# Patient Record
Sex: Male | Born: 1975 | Race: White | Hispanic: No | Marital: Married | Smoking: Never smoker
Health system: Southern US, Community
[De-identification: ages and names within clinical notes are randomized; demographics above are authoritative.]

## PROBLEM LIST (undated history)

## (undated) DIAGNOSIS — E785 Hyperlipidemia, unspecified: Secondary | ICD-10-CM

## (undated) DIAGNOSIS — D239 Other benign neoplasm of skin, unspecified: Secondary | ICD-10-CM

## (undated) HISTORY — PX: KNEE SURGERY: SHX244

---

## 2000-10-15 ENCOUNTER — Encounter: Admission: RE | Admit: 2000-10-15 | Discharge: 2000-10-15 | Payer: Self-pay | Admitting: Sports Medicine

## 2002-04-22 ENCOUNTER — Encounter: Admission: RE | Admit: 2002-04-22 | Discharge: 2002-04-22 | Payer: Self-pay | Admitting: Family Medicine

## 2004-01-08 ENCOUNTER — Ambulatory Visit (HOSPITAL_COMMUNITY): Admission: RE | Admit: 2004-01-08 | Discharge: 2004-01-08 | Payer: Self-pay | Admitting: Gastroenterology

## 2009-04-05 ENCOUNTER — Ambulatory Visit: Payer: Self-pay | Admitting: Sports Medicine

## 2009-04-05 DIAGNOSIS — M7750 Other enthesopathy of unspecified foot: Secondary | ICD-10-CM | POA: Insufficient documentation

## 2010-12-02 NOTE — Op Note (Signed)
NAME:  Randy Quinn, Randy Quinn                         ACCOUNT NO.:  [id number]   MEDICAL RECORD NO.:  [id number]                   PATIENT TYPE:  AMB   LOCATION:  ENDO                                 FACILITY:  [facility]   PHYSICIAN:  John C. Madilyn Fireman, M.D.                 DATE OF BIRTH:  [birth year]   DATE OF PROCEDURE:  01/08/2004  DATE OF DISCHARGE:                                 OPERATIVE REPORT   PROCEDURE:  Esophagogastroduodenoscopy with esophageal dilatation.   INDICATIONS FOR PROCEDURE:  Probable solid food dysphagia with some atypical  features by description but overall suggestive of lower esophageal ring or  stricture.   DESCRIPTION OF PROCEDURE:  The patient was placed in the left lateral  decubitus position then placed on the pulse monitor with continuous low flow  oxygen delivered by nasal cannula. He was sedated with 75 mcg IV fentanyl  and 6 mg IV Versed. The Olympus video endoscope was advanced under direct  vision into the oropharynx and esophagus. The esophagus was straight and of  normal caliber with the squamocolumnar line at 38 cm. There did appear to be  a subtle lower esophageal ring or stricture that was only seen well on the  one occasion and photographed and I could not be sure it did not represent a  moment of spasm at the GE junction.  There appeared to be a small hiatal  hernia distal to it.  The esophagus otherwise appeared normal.  The stomach  was entered and a small amount of liquid secretions were suctioned from the  fundus. Retroflexed view of the cardia was unremarkable. The fundus, body,  antrum and pylorus all appeared normal. The duodenum was entered and both  the bulb and second portion were well inspected and appeared to be within  normal limits. The Savary guidewire was passed through the endoscope channel  and the scope withdrawn.  A single 18 mm Savary dilator was passed over the  guidewire with mild resistance and no blood seen on withdrawal.  The  dilators were removed together with the wire and the patient returned to the  recovery room in stable condition.  He tolerated the procedure well and  there were no immediate complications.   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   PLAN:  Advance diet and observe response to dilatation, if no response will  obtain barium swallow to assess for esophageal dysmotility and more  conclusively rule out a Zenker's diverticulum or cricopharyngeal narrowing  of some sort.                                               John C. Madilyn Fireman, M.D.    JCH/MEDQ  D:  01/08/2004  T:  01/08/2004  Job:  [id number] 

## 2013-01-02 ENCOUNTER — Telehealth: Payer: Self-pay | Admitting: Gastroenterology

## 2013-01-02 MED ORDER — PENICILLIN V POTASSIUM 500 MG PO TABS: 500.0000 mg | ORAL_TABLET | Freq: Two times a day (BID) | ORAL | Status: DC

## 2013-01-02 NOTE — Telephone Encounter (Signed)
Randy Quinn is a friend of mine.  His 37 yo son has confirmed strep throat and was put on abx today.  Randy Quinn's throat is very sore now as well, erythematous on exam.  He called his [facility] but has not heard back after an hour.  [facility].  He is going on business trip tomorrow  Given high index of suspicion I am going to start him on pen v 500mg  bid for 10 days.  He has no drug allergies.

## 2013-03-13 ENCOUNTER — Ambulatory Visit (INDEPENDENT_AMBULATORY_CARE_PROVIDER_SITE_OTHER): Payer: BC Managed Care – PPO | Admitting: Sports Medicine

## 2013-03-13 VITALS — BP 131/74 | Ht 70.0 in | Wt 190.0 lb

## 2013-03-13 DIAGNOSIS — IMO0002 Reserved for concepts with insufficient information to code with codable children: Secondary | ICD-10-CM

## 2013-03-13 DIAGNOSIS — M9905 Segmental and somatic dysfunction of pelvic region: Secondary | ICD-10-CM | POA: Insufficient documentation

## 2013-03-13 DIAGNOSIS — M999 Biomechanical lesion, unspecified: Secondary | ICD-10-CM

## 2013-03-13 DIAGNOSIS — S76912A Strain of unspecified muscles, fascia and tendons at thigh level, left thigh, initial encounter: Secondary | ICD-10-CM | POA: Insufficient documentation

## 2013-03-13 NOTE — Progress Notes (Signed)
  [facility]  Patient name: Randy Quinn MRN [id number]  Date of birth: November 14, 1975  CC & HPI:  Randy Quinn is a 37 y.o. male who is returning to care.   Patient presents today for evaluation of diffuse low back pain.  Pain began 4 days ago while out watering his vegetablel garden.  He reports he was bending over for prolonged period of time and stood up abruptly and had acute onset of sharp stabbing bilateral lower back pain.  Does report his symptoms seem to be worse on the left compared to the right.  He has no radicular symptoms, no weakness, no paresthesia.  Reports that bending over and bracing his back seems to help his symptoms.  He is currently training for marathon.  He has not had any prior back pain prior to this episode.  He not been doing any activity leading up to his gardening and had taken an easy week that week with his running.  He has worked up to 15 miles.  Since his injury he has been taking ibuprofen 400-600mg  2-3 times per day but has not taken any today.  Reports the pain does not seem like it is significantly improved over the last 2 days but is much better than his initial injury.  Patient denies any fevers, chills, weight loss,   ROS:  See HPI  Pertinent History Reviewed:  Medical & Surgical Hx:  Reviewed: Significant for otherwise healthy Medications: Reviewed & Updated - see associated section Social History: Reviewed -  has no tobacco history on file.   Objective Findings:  Vitals: BP 131/74  Ht 5\' 10"  (1.778 m)  Wt 190 lb (86.183 kg)  BMI 27.26 kg/m2  PE: GENERAL:  adult Caucasian male. In no discomfort; no respiratory distress  PSYCH:  alert and appropriate, good insight      NeuroMSK  back Exam: Appear:  normal appearance, no bruising, no ecchymosis.    Palp:  history palpation over the left lumbar paraspinal musculature.  No midline tenderness..    NV:   The bilateral lower extremity myotomes are 5+/5.  Sensation is grossly  intact to light sensation in lower extremity dermatomes..  No marked tenderness of Quadratus Lumborum  Testing:  negative straight leg raise bilaterally.  He has a functional leg length discrepancy on the left that is worsened with sitting but corrects with further evaluation and treatment.   ASIS compression test positive on the left.  Left anterior Innominate.  Initially positive Thompson test on the left that corrects.  He has pain with left-sided lunge stretch.  Mildly positive FABERon the left radiating to the lateral thigh but good motion; normal FADIR.  Normal FABER and FADIR on the right. L3 flexed rotated left side bent right  Left anterior innominate          Assessment & Plan:   1. Strain of left iliopsoas muscle   2. Somatic dysfunction of pelvic region    See problem associated charting

## 2013-03-13 NOTE — Assessment & Plan Note (Addendum)
OMT today. Given hip flexor  stretching exercises and SI mobilization stretches.   Continue ibuprofen when necessary. No evidence for acute disc . Followup as needed, discussed likely recovery process and can advance to attempting to run again if able to walk without pain later this week.

## 2013-03-13 NOTE — Assessment & Plan Note (Signed)
Decision today to treat with OMT was based on Physical Exam.  Verbal consent was obtained after explanation of risks, benefits and potential side-effects including acute pain flare, post-manipulation soreness, and need for repeat treatments.    The Patient was treated with HVLA, ME techniques in Pelvis, Lumbar and Sacral areas  Patient tolerated the procedure well with improvement in symptoms.  Patient given medications, exercises, stretches and lifestyle modifications per AVS and verbally.    Patient will follow up as needed

## 2013-12-02 ENCOUNTER — Ambulatory Visit (INDEPENDENT_AMBULATORY_CARE_PROVIDER_SITE_OTHER): Payer: BC Managed Care – PPO | Admitting: Sports Medicine

## 2013-12-02 ENCOUNTER — Encounter: Payer: Self-pay | Admitting: Sports Medicine

## 2013-12-02 VITALS — BP 133/81 | Ht 70.0 in | Wt 200.0 lb

## 2013-12-02 DIAGNOSIS — S76111A Strain of right quadriceps muscle, fascia and tendon, initial encounter: Secondary | ICD-10-CM | POA: Insufficient documentation

## 2013-12-02 DIAGNOSIS — M719 Bursopathy, unspecified: Secondary | ICD-10-CM

## 2013-12-02 DIAGNOSIS — M6790 Unspecified disorder of synovium and tendon, unspecified site: Secondary | ICD-10-CM

## 2013-12-02 DIAGNOSIS — IMO0002 Reserved for concepts with insufficient information to code with codable children: Secondary | ICD-10-CM

## 2013-12-02 NOTE — Progress Notes (Signed)
   [facility] [address] [place], [place] [address] Phone: [phone] Fax: [phone]   Patient Name: Randy SnufferKevin M Quinn Date of Birth: 1976/01/22 Medical Record Number: [id number] Gender: male Date of Encounter: 12/02/2013  History of Present Illness:  Randy SnufferKevin M Masterson is a 38 y.o. very pleasant male patient who presents with the following:  R knee pain. [name] describes that the pain began about 7-8 weeks ago when he was playing basketball. He states that he planted with both feet, heard a pop and then had pain. He tried to play 2 times in the next two weeks and continued to have pain and swelling so he took 3 weeks off. When he went back to playing he had more pain and swelling. He went fly fishing last week which caused the most significant pain and swelling he had had yet.   He describes his knee pain as superior and lateral to the patella and non radiating. He denies locking and popping but state that the pain is worse when he is going up or down stairs. He has run since the injury and states that the pain is not bad when he is on flat land but hurts bad on hills.   His knee does "give out" on occasion.   Patient Active Problem List   Diagnosis Date Noted  . Strain of left iliopsoas muscle 03/13/2013  . Somatic dysfunction of pelvic region 03/13/2013  . BURSITIS, CALCANEAL 04/05/2009   No past medical history on file. No past surgical history on file. History  Substance Use Topics  . Smoking status: Never Smoker   . Smokeless tobacco: Not on file  . Alcohol Use: Not on file   No family history on file. No Known Allergies  Medication list has been reviewed and updated.  Prior to Admission medications   Medication Sig Start Date End Date Taking? Authorizing Provider  penicillin v potassium (VEETID) 500 MG tablet Take 1 tablet (500 mg total) by mouth 2 (two) times daily. 01/02/13   Rachael Feeaniel P Jacobs, MD    Review of Systems:  Per  HPI  Physical Examination: Filed Vitals:   12/02/13 0912  BP: 133/81   Filed Vitals:   12/02/13 0912  Height: 5\' 10"  (1.778 m)  Weight: 200 lb (90.719 kg)   Body mass index is 28.7 kg/(m^2).  Gen: NAD, alert, cooperative with exam HEENT: NCAT Neuro: Alert and oriented, No gross deficits MSK: R knee without erythema, bruising, or gross deformity Slight effusion on superior lateral knee No joint line tenderness or tenderness to palpation ligamentously intact to Lachman's and with varus and valgus stress.  Negative Thessaly's test but some mild pain. Neg. McMurray  Small defect in lateral quad 5 cm above lateral patella   MSK US: large suprapatellar pouch effusion; there is some disruption of VL fibers over lateral patellar attachment; med and lateral meniscus look good; patellar tendon nl.  Assessment and Plan:   Tendon injury Subacute injury to vastus lateralis tendon evident on MSk US today - Recommended compression sleeve - Ice, NSAIDs,  - biking, and straight/lateral leg raises.  - F/u US in 4 weeks.      Elenora GammaSamuel L Jordie Skalsky, MD

## 2013-12-02 NOTE — Assessment & Plan Note (Addendum)
Subacute injury to vastus lateralis tendon evident on MSk US today - Recommended compression sleeve - Ice, NSAIDs,  - biking, and straight/lateral leg raises.  - F/u US in 4 weeks.   Consider NTG if not healing in 6 wks

## 2013-12-02 NOTE — Patient Instructions (Signed)
You have injured the tendon of your vastus lateralis muscle  Wear the compression sleeve with activity  Continue Ice, NSIADs (ibuprofen or aleve), and exercise to avoid pain (think stationary bike)  Do straight leg and lateral leg raises.

## 2013-12-16 ENCOUNTER — Ambulatory Visit: Payer: BC Managed Care – PPO | Admitting: Sports Medicine

## 2013-12-31 ENCOUNTER — Ambulatory Visit (INDEPENDENT_AMBULATORY_CARE_PROVIDER_SITE_OTHER): Payer: BC Managed Care – PPO | Admitting: Sports Medicine

## 2013-12-31 ENCOUNTER — Encounter: Payer: Self-pay | Admitting: Sports Medicine

## 2013-12-31 VITALS — BP 126/81 | Ht 70.0 in | Wt 200.0 lb

## 2013-12-31 DIAGNOSIS — IMO0002 Reserved for concepts with insufficient information to code with codable children: Secondary | ICD-10-CM

## 2013-12-31 DIAGNOSIS — S76111A Strain of right quadriceps muscle, fascia and tendon, initial encounter: Secondary | ICD-10-CM

## 2013-12-31 NOTE — Patient Instructions (Signed)
Tear is healing pretty fast  Go ahead and increase activity Easy running but avoid uphill Careful downhill - stretches tendon - careful on too many steps  Bike when you get a chance  OK to start some easy squats, lunges, and other quad exercises like step ups on low step  Compression sleeve for another 2 mons  Avoid deep knee bends with all exercises  Wait at least 1 month before any real basketball and then ease into this  If any symptoms in 2 mos recheck

## 2013-12-31 NOTE — Assessment & Plan Note (Signed)
This is healing well Use compression sleeve for at least 2 more months  Increase activity level as outlined  Recheck in 2 months if he still has any symptoms

## 2013-12-31 NOTE — Progress Notes (Signed)
Patient ID: Randy SnufferKevin M Quinn, male   DOB: [birth year], 38 y.o.   MRN: [id number]  Patient returns for a followup of her right knee injury. on ultrasound on last visit he had a quadriceps tendon tear involving the vastus lateralis that retracted about 2 cm and was associated with a significant suprapatellar pouch effusion.  Since last visit he has worn compression sleeve. He has done some of the exercises but not as vigorously. He has not bike but he has been walking.  Currently he has mild pain primarily with going up or down steps or with getting out of a car. Much less swelling and much less pain on other activities.  Examination No acute distress BP 126/81  Ht 5\' 10"  (1.778 m)  Wt 200 lb (90.719 kg)  BMI 28.70 kg/m2  RT Knee: Normal to inspection with no erythema or effusion or obvious bony abnormalities. Palpation normal with no warmth or joint line tenderness or patellar tenderness or condyle tenderness. ROM normal in flexion and extension and lower leg rotation. Ligaments with solid consistent endpoints including ACL, PCL, LCL, MCL. Negative Mcmurray's and provocative meniscal tests. Non painful patellar compression. Patellar tendon unremarkable. Hamstring and quadriceps strength is normal.  There is a small defect which is about 2 cm above the lateral superior patella in the VMO tendon This is nontender  Ultrasound  Compared to last ultrasound the retraction of the VMO tendon is now down to 1/2 cm. The remainder of the quadriceps tendon is normal The suprapatellar pouch effusion looks to be about 25% of what was noted before  Remainder of the structures look normal.

## 2014-02-03 ENCOUNTER — Ambulatory Visit (INDEPENDENT_AMBULATORY_CARE_PROVIDER_SITE_OTHER): Payer: BC Managed Care – PPO | Admitting: Sports Medicine

## 2014-02-03 ENCOUNTER — Encounter: Payer: Self-pay | Admitting: Sports Medicine

## 2014-02-03 VITALS — BP 143/82 | HR 54 | Ht 70.0 in | Wt 200.0 lb

## 2014-02-03 DIAGNOSIS — M25569 Pain in unspecified knee: Secondary | ICD-10-CM

## 2014-02-03 DIAGNOSIS — M25561 Pain in right knee: Secondary | ICD-10-CM | POA: Insufficient documentation

## 2014-02-03 NOTE — Progress Notes (Signed)
  Randy Quinn - 38 y.o. male MRN [id number]  Date of birth: Dec 13, 1975    SUBJECTIVE:     38 year old male with recent vastus lateralis strain/tear presents today with a new complaint of right knee pain.  He reports that he was playing basketball last week (715) and developed knee pain.  He does not recall any injury. He states that while he was playing, he felt that "something wasn't right".  He developed pain in the lateral popliteal region.  He also developed significant swelling.  He reports that following this he iced frequently and also took ibuprofen.  He denies any associated popping, locking, grinding, clicking.  He also denies any knee instability.  He does note significant pain with full flexion of the knee as well as resisted extension.  ROS:     Per HPI  PERTINENT  PMH / PSH / / SH:  Past Medical, Surgical, Social, and Family History Reviewed.  OBJECTIVE: BP 143/82  Pulse 54  Ht 5\' 10"  (1.778 m)  Wt 200 lb (90.719 kg)  BMI 28.70 kg/m2  Physical Exam:  Vital signs are reviewed. General: Well-built gentleman; NAD. MSK:  Knee Inspection - swelling/effusion noted.  No other visible abnormalities noted.  Palpation - no warmth, joint line tenderness, patellar tenderness, or condyle tenderness.  Patient expressed pain with lateral popliteal fossa. Fullness of popliteal fossa noted. ROM normal in extension and lower leg rotation. Flexion is tight and painful at 125 deg Ligaments with solid consistent endpoints including ACL, PCL, LCL, MCL. Negative Mcmurray's. + Apley grind. Negative Thessaly for instability but mild pain Non painful patellar compression. Patellar glide without crepitus. Patellar and quadriceps tendons unremarkable. Hamstring and quadriceps strength is normal.   MSK US - Limited Right Knee - Normal patellar and quad tendons. - Large effusion noted in suprapatellar pouch ~ 7 cm.  - Normal medial and lateral meniscus (posteriorly as well).    ASSESSMENT &  PLAN: See problem based charting & AVS for pt instructions.

## 2014-02-03 NOTE — Assessment & Plan Note (Signed)
Unclear etiology at this time. Ultrasound today was negative. This could be an underlying OCD lesion or other articular cartilage abnormality. Obtaining MRI.  Will have patient followup following MRI results. Activity - Walking, stationary bike.  No basketball. He should continue ice, ibuprofen, Body Helix sleeve.

## 2014-02-05 ENCOUNTER — Ambulatory Visit
Admission: RE | Admit: 2014-02-05 | Discharge: 2014-02-05 | Disposition: A | Discharge status: Home / Self Care | Payer: BC Managed Care – PPO | Source: Ambulatory Visit | Attending: Sports Medicine | Admitting: Sports Medicine

## 2014-02-05 DIAGNOSIS — M25561 Pain in right knee: Secondary | ICD-10-CM

## 2014-02-05 MED ORDER — GADOBENATE DIMEGLUMINE 529 MG/ML IV SOLN
19.0000 mL | Freq: Once | INTRAVENOUS | Status: AC | PRN
Start: 2014-02-05 — End: 2014-02-05
  Administered 2014-02-05: 19 mL via INTRAVENOUS

## 2014-03-05 ENCOUNTER — Ambulatory Visit (INDEPENDENT_AMBULATORY_CARE_PROVIDER_SITE_OTHER): Payer: BC Managed Care – PPO | Admitting: Sports Medicine

## 2014-03-05 ENCOUNTER — Encounter: Payer: Self-pay | Admitting: Sports Medicine

## 2014-03-05 VITALS — BP 135/77 | HR 60 | Ht 70.0 in | Wt 200.0 lb

## 2014-03-05 DIAGNOSIS — IMO0002 Reserved for concepts with insufficient information to code with codable children: Secondary | ICD-10-CM | POA: Diagnosis not present

## 2014-03-05 DIAGNOSIS — Z5189 Encounter for other specified aftercare: Secondary | ICD-10-CM | POA: Diagnosis not present

## 2014-03-05 DIAGNOSIS — M25569 Pain in unspecified knee: Secondary | ICD-10-CM

## 2014-03-05 DIAGNOSIS — S8331XD Tear of articular cartilage of right knee, current, subsequent encounter: Secondary | ICD-10-CM

## 2014-03-05 DIAGNOSIS — M25561 Pain in right knee: Secondary | ICD-10-CM

## 2014-03-05 NOTE — Progress Notes (Signed)
  Subjective:  Randy Quinn is a 38 y.o. male with a history of vastus lateralis strain here for follow up of right knee pain.  Randy Quinn was seen 7/21 for pain and swelling following a basketball game. An MRI showed osteochondral injury on the anterior medial femoral condyle with subcortical cysts and bony edema as well as a moderate effusion. He has since limited activities to low impact walking, occasional hiking, and swimming with one episode of swelling without further injury. He has used body helix compression sleeve and anti-inflammatory medications as needed. He reports tightness in the back of the knee when fully extended, otherwise pain and swelling have both improved.   He is otherwise well and takes no regular medications. Review of Systems: Denies instability, popping, locking, or giving way.  Objective:  BP 135/77  Pulse 60  Ht 5\' 10"  (1.778 m)  Wt 200 lb (90.719 kg)  BMI 28.70 kg/m2  Gen: Athletic 38 y.o. male in NAD Right knee: Mild swelling without erythema, ecchymosis, or deformity. No warmth or joint line tenderness. No popliteal cyst or pain. AROM shows full extension and 140 deg flexion. Quad/hamstring without atrophy and strength 5/5. Neurovascularly intact. No ligamentous laxity, negative McMurray's. Left knee: No swelling, erythema, deformity, warmth of tenderness to palpation. Full AROM with 145 deg flexion. Strength 5/5. Neurovascularly intact without ligamentous or meniscal injury noted. Assessment:  Randy Quinn is a 38 y.o. 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 Conservative measures have provided pain relief and likely improvement in inflammation and acute injury to degenerative changes. Effusion and ROM have improved.  Plan:  - Continued low impact restrictions on right knee x 2 more months. Recommend stationary bike/swimming. - Ice and anti-inflammatory medications as needed - Follow  up in 2 months with re-scan at that time. Hopeful for re-introduction of low intensity exercises at that time.   Randy Concannon B. Jarvis NewcomerGrunz, MD, PGY-2 03/05/2014 4:49 PM  Reviewed and edited   Sterling BigKB Fields, MD

## 2014-03-06 DIAGNOSIS — S8331XA Tear of articular cartilage of right knee, current, initial encounter: Secondary | ICD-10-CM | POA: Insufficient documentation

## 2014-03-06 NOTE — Assessment & Plan Note (Signed)
He is improving   Do biking for rehab  Use compression  Needs to stay conservative for 2 more mos.

## 2014-03-06 NOTE — Assessment & Plan Note (Signed)
Pain well controlled  Will use aleve or advil

## 2014-05-21 ENCOUNTER — Encounter: Payer: Self-pay | Admitting: Sports Medicine

## 2014-05-21 ENCOUNTER — Ambulatory Visit (INDEPENDENT_AMBULATORY_CARE_PROVIDER_SITE_OTHER): Payer: BC Managed Care – PPO | Admitting: Sports Medicine

## 2014-05-21 VITALS — BP 126/85 | HR 67 | Ht 70.0 in | Wt 210.0 lb

## 2014-05-21 DIAGNOSIS — S8331XA Tear of articular cartilage of right knee, current, initial encounter: Secondary | ICD-10-CM

## 2014-05-21 NOTE — Progress Notes (Signed)
Patient ID: Randy Quinn, male   DOB: 06/11/1976, 38 y.o.   MRN: [id number]  Randy Quinn first injured his knee in July. In August we obtained an MRI which showed an osteochondral lesion, bone bruising and some cystic change in his medial femoral condyle of his right knee. He responded well to conservative care with biking for rehabilitation, a compression sleeve and of running basketball and running. He was doing well until this week and in fact had wanted to start back playing more active sports. Without a specific injury after sitting in a meeting for a long period of time when he tried to stand up the right knee locked. This caused significant pain and a lot of swelling. For this reason he came back in to the office today.  Physical exam No acute distress, athletic male BP 126/85 mmHg  Pulse 67  Ht 5\' 10"  (1.778 m)  Wt 210 lb (95.255 kg)  BMI 30.13 kg/m2  Knee: Patient has a large visible effusion of the right knee Palpation shows  warmth  NO  joint line tenderness or patellar tenderness or condyle tenderness. ROM he lacks 10 of full extension and lacks 20 of full flexion compared to the opposite knee Ligaments with solid consistent endpoints including ACL, PCL, LCL, MCL. Negative Mcmurray's  Non painful patellar compression. Patellar and quadriceps tendons unremarkable. Hamstring and quadriceps strength is normal.

## 2014-05-21 NOTE — Assessment & Plan Note (Signed)
I suspect he has developed a loose body with his known chondral injury and medial femoral condyle bruising This is causing locking of the knee  I think he needs arthroscopic evaluation and we'll refer him to Dr. Elsie LincolnPeter Daldorf  Limit activity until better evaluation.

## 2014-06-04 ENCOUNTER — Ambulatory Visit: Payer: BC Managed Care – PPO | Admitting: Sports Medicine

## 2016-01-16 ENCOUNTER — Encounter (HOSPITAL_COMMUNITY): Payer: Self-pay | Admitting: *Deleted

## 2016-01-16 ENCOUNTER — Emergency Department (HOSPITAL_COMMUNITY)
Admission: EM | Admit: 2016-01-16 | Discharge: 2016-01-16 | Disposition: A | Discharge status: Home / Self Care | Payer: BLUE CROSS/BLUE SHIELD | Attending: Emergency Medicine | Admitting: Emergency Medicine

## 2016-01-16 DIAGNOSIS — T63441A Toxic effect of venom of bees, accidental (unintentional), initial encounter: Secondary | ICD-10-CM | POA: Diagnosis not present

## 2016-01-16 DIAGNOSIS — R21 Rash and other nonspecific skin eruption: Secondary | ICD-10-CM | POA: Diagnosis present

## 2016-01-16 DIAGNOSIS — T63444A Toxic effect of venom of bees, undetermined, initial encounter: Secondary | ICD-10-CM | POA: Diagnosis not present

## 2016-01-16 MED ORDER — FAMOTIDINE IN NACL 20-0.9 MG/50ML-% IV SOLN
20.0000 mg | Freq: Once | INTRAVENOUS | Status: AC
  Administered 2016-01-16: 20 mg via INTRAVENOUS
  Filled 2016-01-16: qty 50

## 2016-01-16 MED ORDER — SODIUM CHLORIDE 0.9 % IV BOLUS (SEPSIS)
1000.0000 mL | Freq: Once | INTRAVENOUS | Status: AC
  Administered 2016-01-16: 1000 mL via INTRAVENOUS

## 2016-01-16 MED ORDER — FAMOTIDINE 20 MG PO TABS: 20.0000 mg | ORAL_TABLET | Freq: Two times a day (BID) | ORAL | Status: DC

## 2016-01-16 MED ORDER — DIPHENHYDRAMINE HCL 25 MG PO TABS: 50.0000 mg | ORAL_TABLET | ORAL | Status: DC | PRN

## 2016-01-16 MED ORDER — DIPHENHYDRAMINE HCL 50 MG/ML IJ SOLN
25.0000 mg | Freq: Once | INTRAMUSCULAR | Status: AC
  Administered 2016-01-16: 25 mg via INTRAVENOUS
  Filled 2016-01-16: qty 1

## 2016-01-16 MED ORDER — PREDNISONE 50 MG PO TABS: ORAL_TABLET | ORAL | Status: DC

## 2016-01-16 MED ORDER — TETANUS-DIPHTH-ACELL PERTUSSIS 5-2.5-18.5 LF-MCG/0.5 IM SUSP
0.5000 mL | Freq: Once | INTRAMUSCULAR | Status: DC
  Filled 2016-01-16: qty 0.5

## 2016-01-16 MED ORDER — METHYLPREDNISOLONE SODIUM SUCC 125 MG IJ SOLR
125.0000 mg | Freq: Once | INTRAMUSCULAR | Status: AC
  Administered 2016-01-16: 125 mg via INTRAVENOUS
  Filled 2016-01-16: qty 2

## 2016-01-16 NOTE — Discharge Instructions (Signed)
Please follow with your primary care doctor in the next 2 days for a check-up. They must obtain records for further management.   Do not hesitate to return to the Emergency Department for any new, worsening or concerning symptoms.    Bee, Wasp, or [employer], wasps, and hornets are part of a family of insects that can sting people. These stings can cause pain and inflammation, but they are usually not serious. However, some people may have an allergic reaction to a sting. This can cause the symptoms to be more severe.  SYMPTOMS  Common symptoms of this condition include:   A red lump in the skin that sometimes has a tiny hole in the center. In some cases, a stinger may be in the center of the wound.  Pain and itching at the sting site.  Redness and swelling around the sting site. If you have an allergic reaction (localized allergic reaction), the swelling and redness may spread out from the sting site. In some cases, this reaction can continue to develop over the next 12-36 hours. In rare cases, a person may have a severe allergic reaction (anaphylactic reaction) to a sting. Symptoms of an anaphylactic reaction may include:   Wheezing or difficulty breathing.  Raised, itchy, red patches on the skin.  Nausea or vomiting.  Abdominal cramping.  Diarrhea.  Chest pain.  Fainting.  Redness of the face (flushing). DIAGNOSIS  This condition is usually diagnosed based on symptoms, medical history, and a physical exam. TREATMENT  Most stings can be treated with:   Icing to reduce swelling.  Medicines (antihistamines) to treat itching or an allergic reaction.  Medicines to help reduce pain. These may be medicines that you take by mouth, or medicated creams or lotions that you apply to your skin. If you were stung by a bee, the stinger and a small sac of poison may be in the wound. This may be removed by brushing across it with a flat card, such as a credit card. Another method  is to pinch the area and pull it out. These methods can help reduce the severity of the body's reaction to the sting.  HOME CARE INSTRUCTIONS   Wash the sting site daily with soap and water as told by your health care provider.  Apply or take over-the-counter and prescription medicines only as told by your health care provider.  If directed, apply ice to the sting area.  Put ice in a plastic bag.  Place a towel between your skin and the bag.  Leave the ice on for 20 minutes, 2-3 times per day.  Do not scratch the sting area.  To lessen pain, try using a paste that is made of water and baking soda. Rub the paste on the sting area and leave it on for 5 minutes.  If you had a severe allergic reaction to a sting, you may need:  To wear a medical bracelet or necklace that lists the allergy.  To learn when and how to use an anaphylaxis kit or epinephrine injection. Your family members may also need to learn this.  To carry an anaphylaxis kit with you at all times. SEEK MEDICAL CARE IF:   Your symptoms do not get better in 2-3 days.  You have redness, swelling, or pain that spreads beyond the area of the sting.  You have a fever. SEEK IMMEDIATE MEDICAL CARE IF:  You have symptoms of a severe allergic reaction. These include:   Wheezing or difficulty  breathing.  Chest pain.  Light-headedness or fainting.  Itchy, raised, red patches on the skin.  Nausea or vomiting.  Abdominal cramping.  Diarrhea.   This information is not intended to replace advice given to you by your health care provider. Make sure you discuss any questions you have with your health care provider.   Document Released: 07/03/2005 Document Revised: 03/24/2015 Document Reviewed: 11/18/2014 Elsevier Interactive Patient Education [employer].

## 2016-01-16 NOTE — ED Notes (Addendum)
Patient was working in yard tearing down a tree when he disrupted a yellow jacket's nest.  Patient now presents with left facial swelling and feels as though his throat is swelling closed.  Patient has stings on trunk, neck and face (including lips).  Patient has no known allergies to bees/hornets.  Patient took 10 ml of children's benadryl PTA. Patient denies nausea.

## 2016-01-16 NOTE — ED Provider Notes (Signed)
CSN: [id number]     Arrival date & time 01/16/16  1328 History   First MD Initiated Contact with Patient 01/16/16 1332     Chief Complaint  Patient presents with  . Insect Bite     (Consider location/radiation/quality/duration/timing/severity/associated sxs/prior Treatment) HPI   Blood pressure 144/93, pulse 74, temperature 98.4 F (36.9 C), temperature source Axillary, resp. rate 21, SpO2 96 %.  Randy Quinn is a 40 y.o. male complaining of multiple bites from yellow jacket wasp after he was tearing down a tree and disrupted in nest. Was bitten on the lips, arms and bilateral lower extremities patient has significant swelling to the lip hives on his abdomen he denies tongue swelling, shortness of breath, nausea, vomiting. Patient took 10 mL of children's Benadryl prior to arrival with little relief, his last tetanus shot is unknown, he has no history of anaphylactic reaction, does not carry a EpiPen. He has no history of diabetes.  History reviewed. No pertinent past medical history. History reviewed. No pertinent past surgical history. No family history on file. Social History  Substance Use Topics  . Smoking status: Never Smoker   . Smokeless tobacco: Never Used  . Alcohol Use: Yes     Comment: several times a week    Review of Systems  10 systems reviewed and found to be negative, except as noted in the HPI.   Allergies  Review of patient's allergies indicates no known allergies.  Home Medications   Prior to Admission medications   Medication Sig Start Date End Date Taking? Authorizing Provider  diphenhydrAMINE (BENADRYL) 25 MG tablet Take 2 tablets (50 mg total) by mouth every 4 (four) hours as needed for itching. 01/16/16   Quamaine Webb, PA-C  famotidine (PEPCID) 20 MG tablet Take 1 tablet (20 mg total) by mouth 2 (two) times daily. 01/16/16   Zolton Dowson, PA-C  predniSONE (DELTASONE) 50 MG tablet Take 1 tablet daily with breakfast 01/16/16   Tymika Grilli,  PA-C   BP 144/93 mmHg  Pulse 74  Temp(Src) 98.4 F (36.9 C) (Axillary)  Resp 21  SpO2 96% Physical Exam  Constitutional: He is oriented to person, place, and time. He appears well-developed and well-nourished. No distress.  HENT:  Head: Normocephalic.  Mouth/Throat: Oropharynx is clear and moist.  Significant swelling to left upper lip no tongue swelling and no swelling in the posterior pharynx, lung sounds are clear to auscultation, patient is reclining comfortably and speaking in complete sentences, no wheezing.  Eyes: Conjunctivae and EOM are normal. Pupils are equal, round, and reactive to light.  Neck: Normal range of motion.  Cardiovascular: Normal rate, regular rhythm and intact distal pulses.   Pulmonary/Chest: Effort normal and breath sounds normal. No stridor.  Abdominal: Soft.  Musculoskeletal: Normal range of motion.  Neurological: He is alert and oriented to person, place, and time.  Skin: Rash noted.  Multiple insect stings with localized edema to lower extremities and upper extremities.  Scattered hives to abdomen  Psychiatric: He has a normal mood and affect.  Nursing note and vitals reviewed.   ED Course  Procedures (including critical care time) Labs Review Labs Reviewed - No data to display  Imaging Review No results found. I have personally reviewed and evaluated these images and lab results as part of my medical decision-making.   EKG Interpretation None      MDM   Final diagnoses:  None    Filed Vitals:   01/16/16 1329  BP: 144/93  Pulse: 74  Temp: 98.4 F (36.9 C)  TempSrc: Axillary  Resp: 21  SpO2: 96%    Medications  Tdap (BOOSTRIX) injection 0.5 mL (0.5 mLs Intramuscular Not Given 01/16/16 1422)  sodium chloride 0.9 % bolus 1,000 mL (0 mLs Intravenous Stopped 01/16/16 1500)  methylPREDNISolone sodium succinate (SOLU-MEDROL) 125 mg/2 mL injection 125 mg (125 mg Intravenous Given 01/16/16 1400)  diphenhydrAMINE (BENADRYL) injection 25  mg (25 mg Intravenous Given 01/16/16 1401)  famotidine (PEPCID) IVPB 20 mg premix (0 mg Intravenous Stopped 01/16/16 1430)    Randy Quinn is 40 y.o. male presenting with Multiple yellow jacket bite splint with swelling to the anterior lip is seen to be a localized swelling, I don't think this is anaphylaxis this is in the area of his pain. He has scattered hives but no secondary organ involvement no nausea, vomiting, shortness of breath. No indication to administer epinephrine at this time, patient will be placed on Solu-Medrol, Pepcid, IV Benadryl and fluids, will observe and reassess for improvement.  Patient reassessed after IV medications and fluids, resolution of his hives, he has some mild improvement in the localized swelling around the area of the bites. Lung sounds remained clear to auscultation and patient reports significant subjective improvement.  Evaluation does not show pathology that would require ongoing emergent intervention or inpatient treatment. Pt is hemodynamically stable and mentating appropriately. Discussed findings and plan with patient/guardian, who agrees with care plan. All questions answered. Return precautions discussed and outpatient follow up given.   New Prescriptions   DIPHENHYDRAMINE (BENADRYL) 25 MG TABLET    Take 2 tablets (50 mg total) by mouth every 4 (four) hours as needed for itching.   FAMOTIDINE (PEPCID) 20 MG TABLET    Take 1 tablet (20 mg total) by mouth 2 (two) times daily.   PREDNISONE (DELTASONE) 50 MG TABLET    Take 1 tablet daily with breakfast         Wynetta Emeryicole Rehman Levinson, PA-C 01/16/16 1507  Gwyneth SproutWhitney Plunkett, MD 01/16/16 [phone]

## 2016-01-16 NOTE — ED Notes (Signed)
All areas of swelling and redness are much smaller than upon arrival.

## 2016-02-17 IMAGING — MR MR KNEE*R* WO/W CM
4 of 8 series · 18 of 40 positions shown · IV contrast (multihance)
Comparison: None.

CLINICAL DATA: Posterior knee pain for 10 days after basketball
injury.

EXAM:
MRI OF THE RIGHT KNEE WITHOUT AND WITH CONTRAST
TECHNIQUE: Multiplanar, multisequence MR imaging of the knee was performed
before and after the administration of intravenous contrast.
CONTRAST:  19mL MULTIHANCE GADOBENATE DIMEGLUMINE 529 MG/ML IV SOLN

[Series 3: PD fat-sat · axial · 4.0mm · 0.31mm/px · z∈[-59,+47]mm · 5 of 25 slices shown (1 of 3)]
[im 1/25]
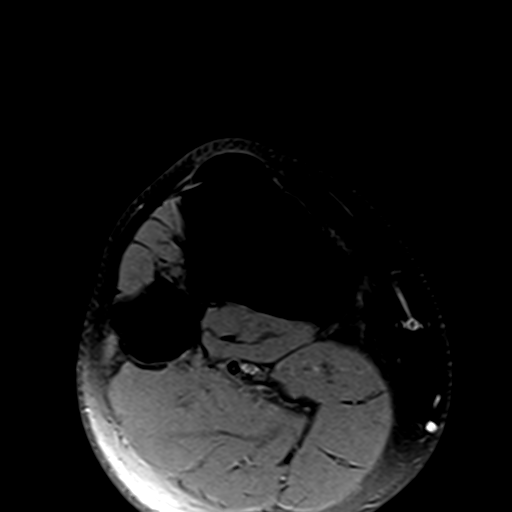
[im 7/25]
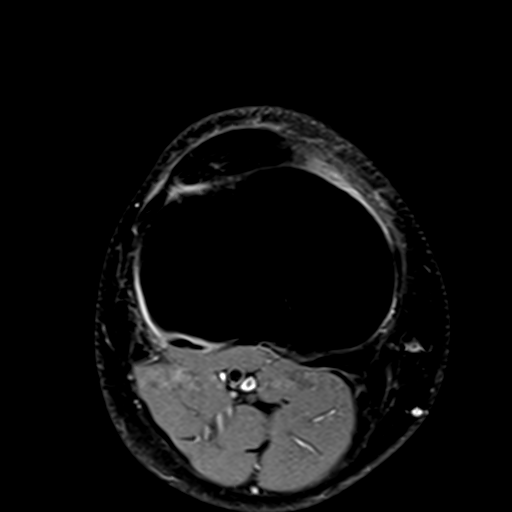
[im 13/25]
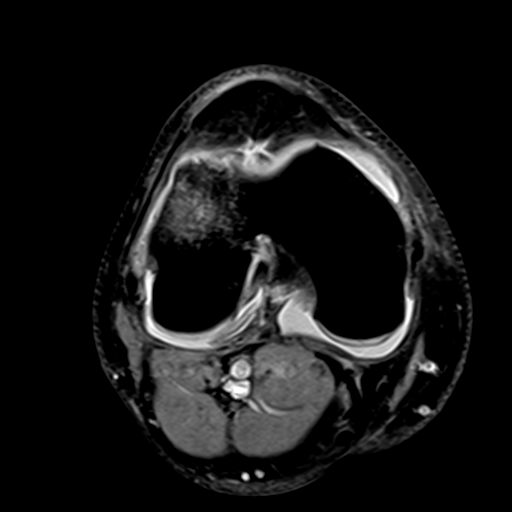
[im 19/25]
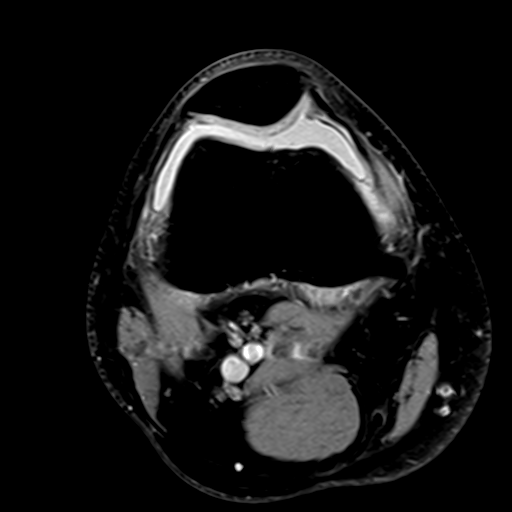
[im 25/25]
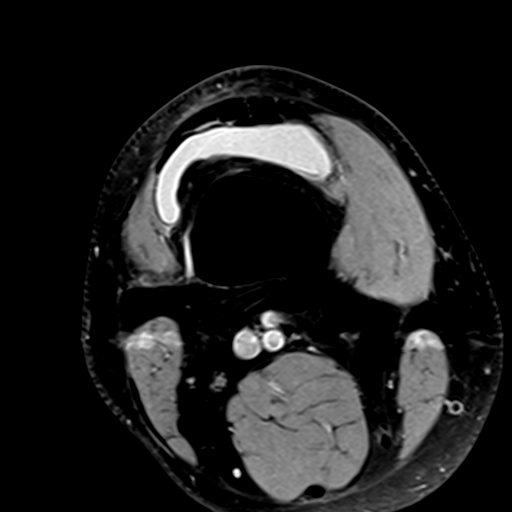

[Series 5: PD fat-sat · sagittal · 3.5mm · 0.31mm/px · 5 of 23 slices shown (2 of 3)]
[im 1/23]
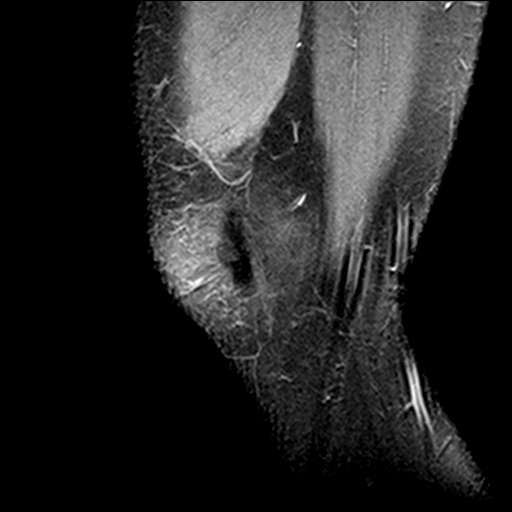
[im 6/23]
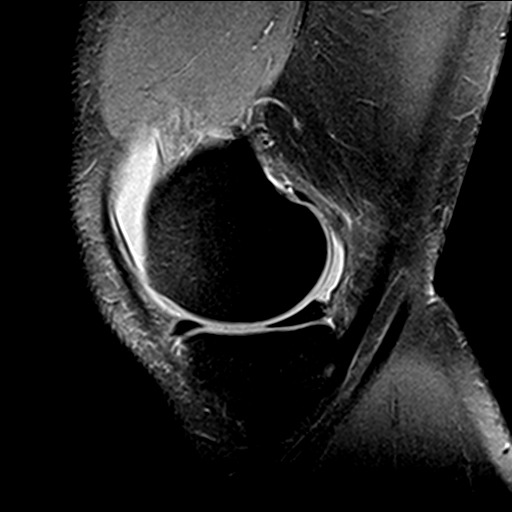
[im 12/23]
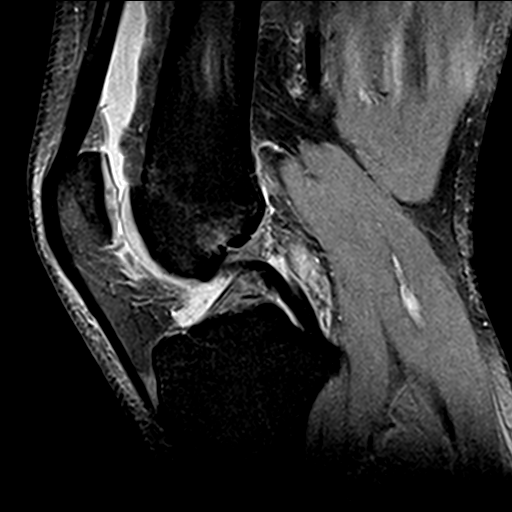
[im 17/23]
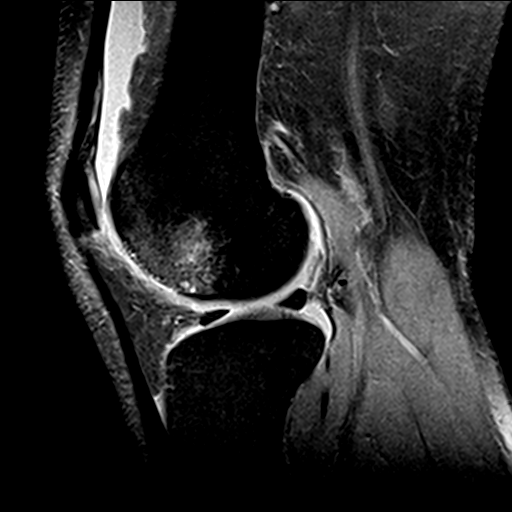
[im 23/23]
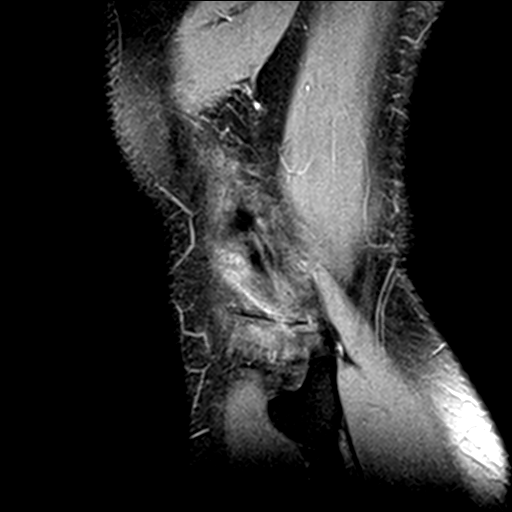

[Series 6: T2 fat-sat · coronal · 3.5mm · 0.31mm/px · 3 of 22 slices shown]
[im 1/22]
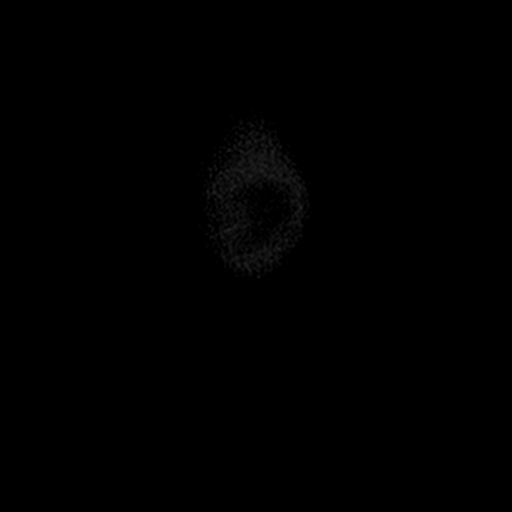
[im 11/22]
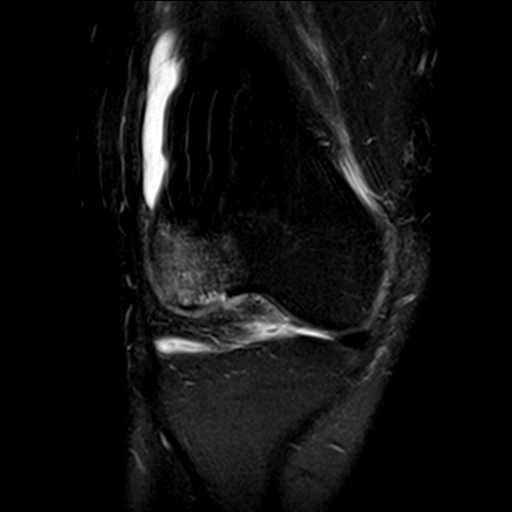
[im 22/22]
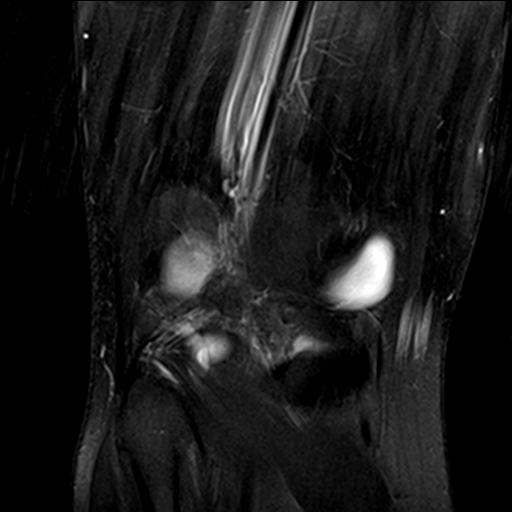

[Series 7: PD fat-sat · coronal · 3.5mm · 0.31mm/px · 5 of 22 slices shown (3 of 3)]
[im 1/22]
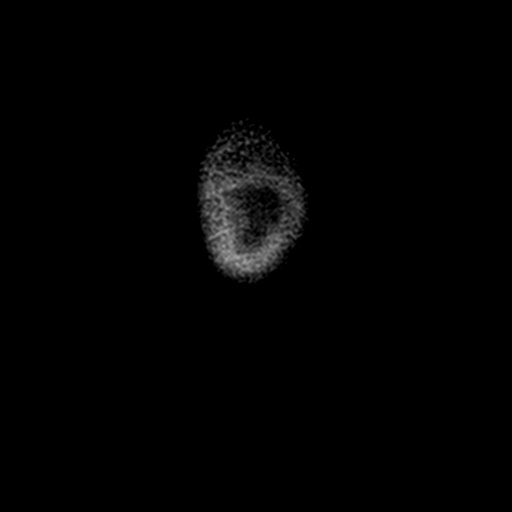
[im 6/22]
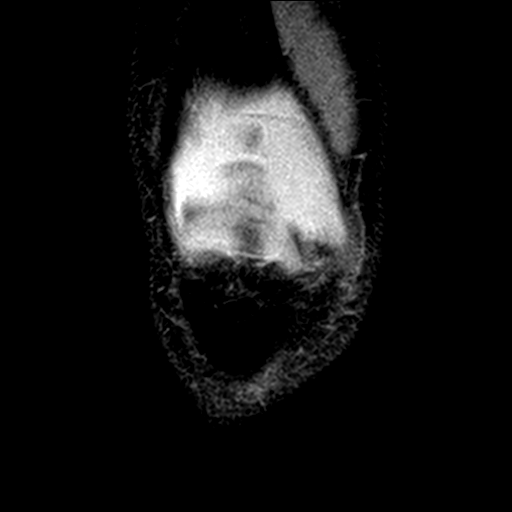
[im 11/22]
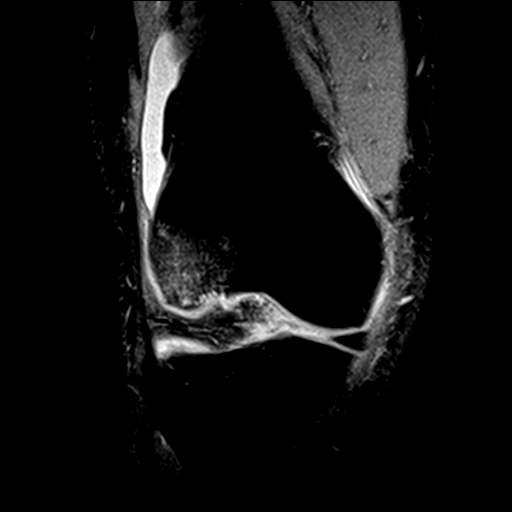
[im 16/22]
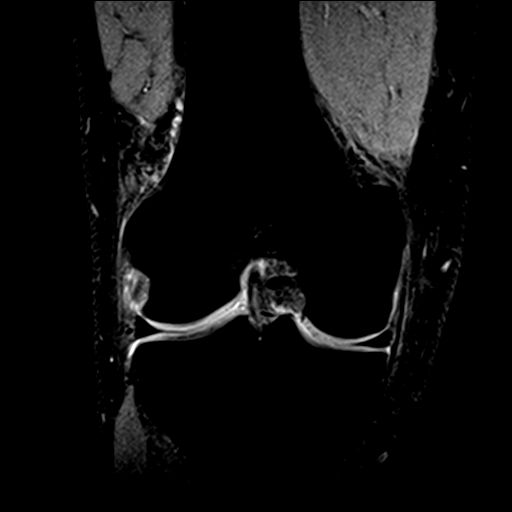
[im 22/22]
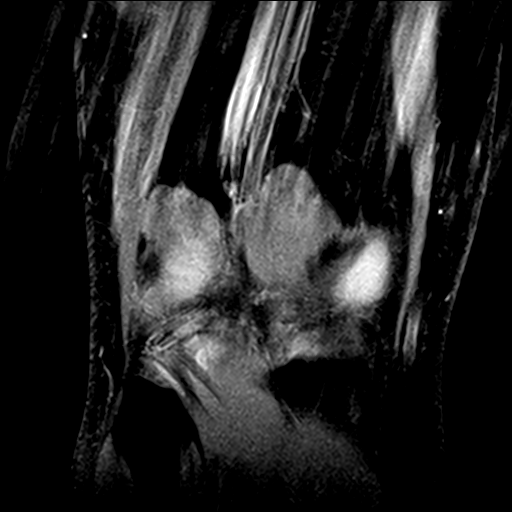

[18 of 40 positions shown; findings below may reference images not displayed]

FINDINGS: MENISCI

Medial meniscus:  Intact

Lateral meniscus:  Intact

LIGAMENTS

Cruciates:  Intact

Collaterals: Mild edema tracks adjacent to the MCL. This can be
incidental but in the appropriate clinical circumstance could
represent grade 1 sprain.

CARTILAGE

Patellofemoral:  Intact

Medial: Abnormal chondral thinning along a 2.3 cm region of the
anterior portion medial femoral condyle, partially involving the
medial femoral trochlear groove, with associated tiny subcortical
cysts and considerable subcortical marrow.

Lateral:  Intact

Joint: Moderate knee effusion. Expected thin enhancement along the
synovium.

Popliteal Fossa:  Intact

Extensor Mechanism:  Intact

Bones: Slight cortical irregularity along the region of chondral
thinning and extensive subcortical edema and enhancement in the
anterior inferior portion of the medial femoral condyle. No
corresponding lesion in the tibial plateau or patella identified.
IMPRESSION: 1. The dominant finding is a abnormal osteochondral lesion along the
anterior inferior portion of the medial femoral condyle, with
associated chondral thinning, small clustered subcortical cystic
lesions, and considerable subcortical edema and enhancement in a
diffuse fashion in this vicinity. I suspect this represents a
combination of a chronic area of chondral thinning and degenerative
subcortical cyst formation, accompanied by acute bone bruising. I do
not see an overt fracture although there is some slight cortical
irregularity along this osteochondral lesion. No overt
fragmentation. No definite free chondral fragment observed loose in
the joint.
2. Moderate knee effusion.
3. Mild edema tracks adjacent to the MCL. This can be incidental but
in the appropriate clinical circumstance could represent grade 1
sprain.

## 2016-07-18 DIAGNOSIS — D2271 Melanocytic nevi of right lower limb, including hip: Secondary | ICD-10-CM | POA: Diagnosis not present

## 2016-07-18 DIAGNOSIS — D2272 Melanocytic nevi of left lower limb, including hip: Secondary | ICD-10-CM | POA: Diagnosis not present

## 2016-07-18 DIAGNOSIS — D2262 Melanocytic nevi of left upper limb, including shoulder: Secondary | ICD-10-CM | POA: Diagnosis not present

## 2016-07-18 DIAGNOSIS — D2261 Melanocytic nevi of right upper limb, including shoulder: Secondary | ICD-10-CM | POA: Diagnosis not present

## 2017-08-07 DIAGNOSIS — Z0000 Encounter for general adult medical examination without abnormal findings: Secondary | ICD-10-CM | POA: Diagnosis not present

## 2017-08-07 DIAGNOSIS — Z131 Encounter for screening for diabetes mellitus: Secondary | ICD-10-CM | POA: Diagnosis not present

## 2017-08-07 DIAGNOSIS — Z23 Encounter for immunization: Secondary | ICD-10-CM | POA: Diagnosis not present

## 2017-08-07 DIAGNOSIS — Z136 Encounter for screening for cardiovascular disorders: Secondary | ICD-10-CM | POA: Diagnosis not present

## 2017-08-21 DIAGNOSIS — D2262 Melanocytic nevi of left upper limb, including shoulder: Secondary | ICD-10-CM | POA: Diagnosis not present

## 2017-08-21 DIAGNOSIS — D2271 Melanocytic nevi of right lower limb, including hip: Secondary | ICD-10-CM | POA: Diagnosis not present

## 2017-08-21 DIAGNOSIS — D2261 Melanocytic nevi of right upper limb, including shoulder: Secondary | ICD-10-CM | POA: Diagnosis not present

## 2017-08-21 DIAGNOSIS — D225 Melanocytic nevi of trunk: Secondary | ICD-10-CM | POA: Diagnosis not present

## 2017-09-06 ENCOUNTER — Ambulatory Visit: Payer: Self-pay | Admitting: Family Medicine

## 2017-09-06 VITALS — BP 130/90 | HR 95 | Temp 101.4°F | Resp 17

## 2017-09-06 DIAGNOSIS — R6889 Other general symptoms and signs: Secondary | ICD-10-CM

## 2017-09-06 DIAGNOSIS — R509 Fever, unspecified: Secondary | ICD-10-CM

## 2017-09-06 LAB — POCT INFLUENZA A/B
Influenza A, POC: NEGATIVE
Influenza B, POC: NEGATIVE

## 2017-09-06 MED ORDER — CETIRIZINE HCL 10 MG PO TABS: 10.0000 mg | ORAL_TABLET | Freq: Every day | ORAL | 11 refills | Status: DC

## 2017-09-06 MED ORDER — OSELTAMIVIR PHOSPHATE 75 MG PO CAPS: 75.0000 mg | ORAL_CAPSULE | Freq: Two times a day (BID) | ORAL | 0 refills | Status: DC

## 2017-09-06 MED ORDER — IPRATROPIUM BROMIDE 0.03 % NA SOLN: 2.0000 | Freq: Two times a day (BID) | NASAL | 0 refills | Status: DC

## 2017-09-06 NOTE — Progress Notes (Signed)
Subjective:  Randy Quinn is a 42 y.o. male who presents for evaluation of influenza like symptoms.  Symptoms include achiness, congestion and fever.  Onset of symptoms was occurred today and fever measured at 101.2 F. Reports exposure to influenza at work. Treatment to date:  none.  High risk factors for influenza complications:  none.  The following portions of the patient's history were reviewed and updated as appropriate:  allergies, current medications and past medical history.  Pertinent items are noted in HPI.  Today's Vitals   09/06/17 1752  BP: 130/90  Pulse: 95  Resp: 17  Temp: (!) 101.4 F (38.6 C)  TempSrc: Oral  SpO2: 95%   Objective:  BP 130/90 (BP Location: Right Arm, Patient Position: Sitting, Cuff Size: Normal)   Pulse 95   Temp (!) 101.4 F (38.6 C) (Oral)   Resp 17   SpO2 95%  General appearance: alert, cooperative and ill-appearing  Nose: Nares normal. Septum midline. Mucosa normal. No drainage or sinus tenderness., mucus thick and dark green bilateral turbibnates  Throat: lips, mucosa, and tongue normal; teeth and gums normal Lungs: clear to auscultation bilaterally Heart: regular rate and rhythm, S1, S2 normal, no murmur, click, rub or gallop Skin: Skin color, texture, turgor normal. No rashes or lesions Lymph nodes: Cervical adenopathy: negative  Assessment:  1. Fever, unspecified fever cause 2. Flu-like symptoms  Rapid flu negative. Symptoms however are consistent with influenza. Plan:  Strict return precautions if symptoms worsen or do not improve.   Meds ordered this encounter  Medications  . DISCONTD: oseltamivir (TAMIFLU) 75 MG capsule    Sig: Take 1 capsule (75 mg total) by mouth 2 (two) times daily.    Dispense:  10 capsule    Refill:  0  . DISCONTD: ipratropium (ATROVENT) 0.03 % nasal spray    Sig: Place 2 sprays into both nostrils 2 (two) times daily.    Dispense:  30 mL    Refill:  0  . cetirizine (ZYRTEC) 10 MG tablet    Sig: Take 1  tablet (10 mg total) by mouth daily.    Dispense:  30 tablet    Refill:  11  . ipratropium (ATROVENT) 0.03 % nasal spray    Sig: Place 2 sprays into both nostrils 2 (two) times daily.    Dispense:  30 mL    Refill:  0  . oseltamivir (TAMIFLU) 75 MG capsule    Sig: Take 1 capsule (75 mg total) by mouth 2 (two) times daily.    Dispense:  10 capsule    Refill:  0      Godfrey PickKimberly S. Tiburcio PeaHarris, MSN, FNP-C [id number]  [place], [place] [address] [phone]

## 2017-09-06 NOTE — Patient Instructions (Signed)
Start Tamiflu 75 mg twice daily times 5 days which is the treatment dose for influenza. For nasal symptoms I have prescribed Atrovent nasal spray 2 sprays per nare twice daily until congestion symptoms resolve.  I also recommend cetirizine 10 mg once daily at bedtime also to help with nasal congestion and upper respiratory symptoms associated with influenza.  For fever I recommend over-the-counter Tylenol or ibuprofen consistently over the next few days until fever resolves.  If symptoms worsen or do not improve return for care.  Recommend remaining out of work until fever is absent and symptoms improve for a complete 24 hours.    Influenza, Adult Influenza ("the flu") is an infection in the lungs, nose, and throat (respiratory tract). It is caused by a virus. The flu causes many common cold symptoms, as well as a high fever and body aches. It can make you feel very sick. The flu spreads easily from person to person (is contagious). Getting a flu shot (influenza vaccination) every year is the best way to prevent the flu. Follow these instructions at home:  Take over-the-counter and prescription medicines only as told by your doctor.  Use a cool mist humidifier to add moisture (humidity) to the air in your home. This can make it easier to breathe.  Rest as needed.  Drink enough fluid to keep your pee (urine) clear or pale yellow.  Cover your mouth and nose when you cough or sneeze.  Wash your hands with soap and water often, especially after you cough or sneeze. If you cannot use soap and water, use hand sanitizer.  Stay home from work or school as told by your doctor. Unless you are visiting your doctor, try to avoid leaving home until your fever has been gone for 24 hours without the use of medicine.  Keep all follow-up visits as told by your doctor. This is important. How is this prevented?  Getting a yearly (annual) flu shot is the best way to avoid getting the flu. You may get the flu  shot in late summer, fall, or winter. Ask your doctor when you should get your flu shot.  Wash your hands often or use hand sanitizer often.  Avoid contact with people who are sick during cold and flu season.  Eat healthy foods.  Drink plenty of fluids.  Get enough sleep.  Exercise regularly. Contact a doctor if:  You get new symptoms.  You have: ? Chest pain. ? Watery poop (diarrhea). ? A fever.  Your cough gets worse.  You start to have more mucus.  You feel sick to your stomach (nauseous).  You throw up (vomit). Get help right away if:  You start to be short of breath or have trouble breathing.  Your skin or nails turn a bluish color.  You have very bad pain or stiffness in your neck.  You get a sudden headache.  You get sudden pain in your face or ear.  You cannot stop throwing up. This information is not intended to replace advice given to you by your health care provider. Make sure you discuss any questions you have with your health care provider. Document Released: 04/11/2008 Document Revised: 12/09/2015 Document Reviewed: 04/27/2015 Elsevier Interactive Patient Education  2017 [employer]

## 2017-09-12 ENCOUNTER — Telehealth: Payer: Self-pay

## 2017-09-12 NOTE — Telephone Encounter (Signed)
Called and spoke with pt to see how he was doing since his visit with us and pt states he is doing fine.

## 2018-07-23 DIAGNOSIS — M7541 Impingement syndrome of right shoulder: Secondary | ICD-10-CM | POA: Diagnosis not present

## 2018-07-23 DIAGNOSIS — M654 Radial styloid tenosynovitis [de Quervain]: Secondary | ICD-10-CM | POA: Diagnosis not present

## 2018-07-23 DIAGNOSIS — M25531 Pain in right wrist: Secondary | ICD-10-CM | POA: Diagnosis not present

## 2018-07-23 DIAGNOSIS — M25511 Pain in right shoulder: Secondary | ICD-10-CM | POA: Diagnosis not present

## 2018-08-08 DIAGNOSIS — Z0000 Encounter for general adult medical examination without abnormal findings: Secondary | ICD-10-CM | POA: Diagnosis not present

## 2018-08-08 DIAGNOSIS — Z23 Encounter for immunization: Secondary | ICD-10-CM | POA: Diagnosis not present

## 2018-08-08 DIAGNOSIS — E7800 Pure hypercholesterolemia, unspecified: Secondary | ICD-10-CM | POA: Diagnosis not present

## 2018-08-20 MED FILL — OSELTAMIVIR PHOSPHATE 75 MG: 75 | 10 days supply | Qty: 10 | Fill #0

## 2018-08-22 DIAGNOSIS — D225 Melanocytic nevi of trunk: Secondary | ICD-10-CM | POA: Diagnosis not present

## 2018-08-22 DIAGNOSIS — D2271 Melanocytic nevi of right lower limb, including hip: Secondary | ICD-10-CM | POA: Diagnosis not present

## 2018-08-22 DIAGNOSIS — D2262 Melanocytic nevi of left upper limb, including shoulder: Secondary | ICD-10-CM | POA: Diagnosis not present

## 2018-08-22 DIAGNOSIS — D2272 Melanocytic nevi of left lower limb, including hip: Secondary | ICD-10-CM | POA: Diagnosis not present

## 2019-08-11 DIAGNOSIS — Z0000 Encounter for general adult medical examination without abnormal findings: Secondary | ICD-10-CM | POA: Diagnosis not present

## 2019-08-11 DIAGNOSIS — E7800 Pure hypercholesterolemia, unspecified: Secondary | ICD-10-CM | POA: Diagnosis not present

## 2019-08-11 DIAGNOSIS — Z23 Encounter for immunization: Secondary | ICD-10-CM | POA: Diagnosis not present

## 2019-09-01 DIAGNOSIS — D2261 Melanocytic nevi of right upper limb, including shoulder: Secondary | ICD-10-CM | POA: Diagnosis not present

## 2019-09-01 DIAGNOSIS — D2262 Melanocytic nevi of left upper limb, including shoulder: Secondary | ICD-10-CM | POA: Diagnosis not present

## 2019-09-01 DIAGNOSIS — D225 Melanocytic nevi of trunk: Secondary | ICD-10-CM | POA: Diagnosis not present

## 2019-09-01 DIAGNOSIS — D2271 Melanocytic nevi of right lower limb, including hip: Secondary | ICD-10-CM | POA: Diagnosis not present

## 2020-08-13 DIAGNOSIS — E7800 Pure hypercholesterolemia, unspecified: Secondary | ICD-10-CM | POA: Diagnosis not present

## 2020-08-13 DIAGNOSIS — Z131 Encounter for screening for diabetes mellitus: Secondary | ICD-10-CM | POA: Diagnosis not present

## 2020-08-13 DIAGNOSIS — Z0000 Encounter for general adult medical examination without abnormal findings: Secondary | ICD-10-CM | POA: Diagnosis not present

## 2020-08-31 DIAGNOSIS — D225 Melanocytic nevi of trunk: Secondary | ICD-10-CM | POA: Diagnosis not present

## 2020-08-31 DIAGNOSIS — D485 Neoplasm of uncertain behavior of skin: Secondary | ICD-10-CM | POA: Diagnosis not present

## 2020-08-31 DIAGNOSIS — L739 Follicular disorder, unspecified: Secondary | ICD-10-CM | POA: Diagnosis not present

## 2020-08-31 DIAGNOSIS — D2261 Melanocytic nevi of right upper limb, including shoulder: Secondary | ICD-10-CM | POA: Diagnosis not present

## 2020-08-31 DIAGNOSIS — D2262 Melanocytic nevi of left upper limb, including shoulder: Secondary | ICD-10-CM | POA: Diagnosis not present

## 2021-04-11 ENCOUNTER — Other Ambulatory Visit (HOSPITAL_COMMUNITY): Payer: Self-pay

## 2021-04-11 DIAGNOSIS — M9903 Segmental and somatic dysfunction of lumbar region: Secondary | ICD-10-CM | POA: Diagnosis not present

## 2021-04-11 DIAGNOSIS — M545 Low back pain, unspecified: Secondary | ICD-10-CM | POA: Diagnosis not present

## 2021-04-11 DIAGNOSIS — M9904 Segmental and somatic dysfunction of sacral region: Secondary | ICD-10-CM | POA: Diagnosis not present

## 2021-04-11 DIAGNOSIS — M9905 Segmental and somatic dysfunction of pelvic region: Secondary | ICD-10-CM | POA: Diagnosis not present

## 2021-04-11 MED ORDER — PREDNISONE 10 MG PO TABS
ORAL_TABLET | ORAL | 0 refills | Status: DC
  Filled 2021-04-11: qty 21, 6d supply, fill #0

## 2021-04-12 DIAGNOSIS — M9905 Segmental and somatic dysfunction of pelvic region: Secondary | ICD-10-CM | POA: Diagnosis not present

## 2021-04-12 DIAGNOSIS — M9903 Segmental and somatic dysfunction of lumbar region: Secondary | ICD-10-CM | POA: Diagnosis not present

## 2021-04-12 DIAGNOSIS — M545 Low back pain, unspecified: Secondary | ICD-10-CM | POA: Diagnosis not present

## 2021-04-12 DIAGNOSIS — M9904 Segmental and somatic dysfunction of sacral region: Secondary | ICD-10-CM | POA: Diagnosis not present

## 2021-04-13 DIAGNOSIS — M9903 Segmental and somatic dysfunction of lumbar region: Secondary | ICD-10-CM | POA: Diagnosis not present

## 2021-04-13 DIAGNOSIS — M545 Low back pain, unspecified: Secondary | ICD-10-CM | POA: Diagnosis not present

## 2021-04-13 DIAGNOSIS — M9905 Segmental and somatic dysfunction of pelvic region: Secondary | ICD-10-CM | POA: Diagnosis not present

## 2021-04-13 DIAGNOSIS — M9904 Segmental and somatic dysfunction of sacral region: Secondary | ICD-10-CM | POA: Diagnosis not present

## 2021-04-18 DIAGNOSIS — M9903 Segmental and somatic dysfunction of lumbar region: Secondary | ICD-10-CM | POA: Diagnosis not present

## 2021-04-18 DIAGNOSIS — M545 Low back pain, unspecified: Secondary | ICD-10-CM | POA: Diagnosis not present

## 2021-04-18 DIAGNOSIS — M9904 Segmental and somatic dysfunction of sacral region: Secondary | ICD-10-CM | POA: Diagnosis not present

## 2021-04-18 DIAGNOSIS — M9905 Segmental and somatic dysfunction of pelvic region: Secondary | ICD-10-CM | POA: Diagnosis not present

## 2021-04-20 DIAGNOSIS — M9905 Segmental and somatic dysfunction of pelvic region: Secondary | ICD-10-CM | POA: Diagnosis not present

## 2021-04-20 DIAGNOSIS — M545 Low back pain, unspecified: Secondary | ICD-10-CM | POA: Diagnosis not present

## 2021-04-20 DIAGNOSIS — M9903 Segmental and somatic dysfunction of lumbar region: Secondary | ICD-10-CM | POA: Diagnosis not present

## 2021-04-20 DIAGNOSIS — M9904 Segmental and somatic dysfunction of sacral region: Secondary | ICD-10-CM | POA: Diagnosis not present

## 2021-08-15 DIAGNOSIS — Z131 Encounter for screening for diabetes mellitus: Secondary | ICD-10-CM | POA: Diagnosis not present

## 2021-08-15 DIAGNOSIS — Z0000 Encounter for general adult medical examination without abnormal findings: Secondary | ICD-10-CM | POA: Diagnosis not present

## 2021-08-15 DIAGNOSIS — Z23 Encounter for immunization: Secondary | ICD-10-CM | POA: Diagnosis not present

## 2021-08-15 DIAGNOSIS — E7800 Pure hypercholesterolemia, unspecified: Secondary | ICD-10-CM | POA: Diagnosis not present

## 2021-09-01 DIAGNOSIS — D2239 Melanocytic nevi of other parts of face: Secondary | ICD-10-CM | POA: Diagnosis not present

## 2021-09-01 DIAGNOSIS — D485 Neoplasm of uncertain behavior of skin: Secondary | ICD-10-CM | POA: Diagnosis not present

## 2021-09-01 DIAGNOSIS — D2272 Melanocytic nevi of left lower limb, including hip: Secondary | ICD-10-CM | POA: Diagnosis not present

## 2021-09-01 DIAGNOSIS — D2271 Melanocytic nevi of right lower limb, including hip: Secondary | ICD-10-CM | POA: Diagnosis not present

## 2021-09-01 DIAGNOSIS — D225 Melanocytic nevi of trunk: Secondary | ICD-10-CM | POA: Diagnosis not present

## 2021-09-19 ENCOUNTER — Other Ambulatory Visit (HOSPITAL_COMMUNITY): Payer: Self-pay

## 2021-09-19 DIAGNOSIS — D485 Neoplasm of uncertain behavior of skin: Secondary | ICD-10-CM | POA: Diagnosis not present

## 2021-09-19 DIAGNOSIS — L988 Other specified disorders of the skin and subcutaneous tissue: Secondary | ICD-10-CM | POA: Diagnosis not present

## 2021-09-19 MED ORDER — MUPIROCIN 2 % EX OINT
TOPICAL_OINTMENT | CUTANEOUS | 0 refills | Status: DC
  Filled 2021-09-19: qty 22, 30d supply, fill #0

## 2021-09-26 ENCOUNTER — Encounter: Payer: Self-pay | Admitting: Gastroenterology

## 2021-09-28 ENCOUNTER — Other Ambulatory Visit (HOSPITAL_COMMUNITY): Payer: Self-pay

## 2021-10-18 ENCOUNTER — Ambulatory Visit (AMBULATORY_SURGERY_CENTER): Payer: BC Managed Care – PPO | Admitting: *Deleted

## 2021-10-18 ENCOUNTER — Other Ambulatory Visit (HOSPITAL_COMMUNITY): Payer: Self-pay

## 2021-10-18 VITALS — Ht 70.0 in | Wt 186.0 lb

## 2021-10-18 DIAGNOSIS — Z1211 Encounter for screening for malignant neoplasm of colon: Secondary | ICD-10-CM

## 2021-10-18 MED ORDER — NA SULFATE-K SULFATE-MG SULF 17.5-3.13-1.6 GM/177ML PO SOLN
1.0000 | Freq: Once | ORAL | 0 refills | Status: DC
  Filled 2021-10-18 – 2021-11-07 (×2): qty 354, 1d supply, fill #0

## 2021-10-18 NOTE — Progress Notes (Signed)
No egg or soy allergy known to patient  ?No issues known to pt with past sedation with any surgeries or procedures ?Patient denies ever being told they had issues or difficulty with intubation  ?No FH of Malignant Hyperthermia ?Pt is not on diet pills ?Pt is not on  home 02  ?Pt is not on blood thinners  ?Pt denies issues with constipation  ?No A fib or A flutter ? ?NO PA's for preps discussed with pt In PV today  ?Discussed with pt there will be an out-of-pocket cost for prep and that varies from $0 to 70 +  dollars - pt verbalized understanding  ? ?Due to the COVID-19 pandemic we are asking patients to follow certain guidelines in PV and the [facility]   ?Pt aware of COVID protocols and [facility] guidelines  ? ?PV completed over the phone. Pt verified name, DOB, address and insurance during PV today.  ?Pt mailed instruction packet with copy of consent form to read and not return, and instructions.  ?Pt encouraged to call with questions or issues.  ?

## 2021-10-28 ENCOUNTER — Other Ambulatory Visit (HOSPITAL_COMMUNITY): Payer: Self-pay

## 2021-10-28 ENCOUNTER — Encounter: Payer: Self-pay | Admitting: Gastroenterology

## 2021-11-07 ENCOUNTER — Other Ambulatory Visit (HOSPITAL_COMMUNITY): Payer: Self-pay

## 2021-11-08 ENCOUNTER — Ambulatory Visit (AMBULATORY_SURGERY_CENTER): Payer: BC Managed Care – PPO | Admitting: Gastroenterology

## 2021-11-08 ENCOUNTER — Encounter: Payer: Self-pay | Admitting: Gastroenterology

## 2021-11-08 VITALS — BP 118/73 | HR 49 | Temp 98.0°F | Resp 16 | Ht 70.0 in | Wt 186.0 lb

## 2021-11-08 DIAGNOSIS — Z1211 Encounter for screening for malignant neoplasm of colon: Secondary | ICD-10-CM | POA: Diagnosis not present

## 2021-11-08 MED ORDER — SODIUM CHLORIDE 0.9 % IV SOLN: 500.0000 mL | INTRAVENOUS | Status: DC

## 2021-11-08 NOTE — Progress Notes (Signed)
PT taken to PACU. Monitors in place. VSS. Report given to RN. 

## 2021-11-08 NOTE — Op Note (Signed)
[facility] ?Patient Name: Randy Quinn ?Procedure Date: 11/08/2021 9:29 AM ?MRN: [id number] ?Endoscopist: Milus Banister , MD ?Age: 46 ?Referring MD:  ?Date of Birth: 27-May-1976 ?Gender: Male ?Account #: [id number] ?Procedure:                Colonoscopy ?Indications:              Screening for colorectal malignant neoplasm ?Medicines:                Monitored Anesthesia Care ?Procedure:                Pre-Anesthesia Assessment: ?                          - Prior to the procedure, a History and Physical  ?                          was performed, and patient medications and  ?                          allergies were reviewed. The patient's tolerance of  ?                          previous anesthesia was also reviewed. The risks  ?                          and benefits of the procedure and the sedation  ?                          options and risks were discussed with the patient.  ?                          All questions were answered, and informed consent  ?                          was obtained. Prior Anticoagulants: The patient has  ?                          taken no previous anticoagulant or antiplatelet  ?                          agents. ASA Grade Assessment: II - A patient with  ?                          mild systemic disease. After reviewing the risks  ?                          and benefits, the patient was deemed in  ?                          satisfactory condition to undergo the procedure. ?                          After obtaining informed consent, the colonoscope  ?  was passed under direct vision. Throughout the  ?                          procedure, the patient's blood pressure, pulse, and  ?                          oxygen saturations were monitored continuously. The  ?                          Olympus CF-HQ190L (Serial# [id number]) Colonoscope was  ?                          introduced through the anus and advanced to the the  ?                          cecum,  identified by appendiceal orifice and  ?                          ileocecal valve. The colonoscopy was performed  ?                          without difficulty. The patient tolerated the  ?                          procedure well. The quality of the bowel  ?                          preparation was good. The ileocecal valve,  ?                          appendiceal orifice, and rectum were photographed. ?Scope In: 9:43:40 AM ?Scope Out: 9:55:26 AM ?Scope Withdrawal Time: 0 hours 8 minutes 31 seconds  ?Total Procedure Duration: 0 hours 11 minutes 46 seconds  ?Findings:                 The entire examined colon appeared normal on direct  ?                          and retroflexion views. ?Complications:            No immediate complications. Estimated blood loss:  ?                          None. ?Estimated Blood Loss:     Estimated blood loss: none. ?Impression:               - The entire examined colon was normal on direct  ?                          and retroflexion views. ?                          - No polyps or cancers. ?Recommendation:           - Patient has a contact number available for  ?  emergencies. The signs and symptoms of potential  ?                          delayed complications were discussed with the  ?                          patient. Return to normal activities tomorrow.  ?                          Written discharge instructions were provided to the  ?                          patient. ?                          - Resume previous diet. ?                          - Continue present medications. ?                          - Repeat colonoscopy in 10 years for screening. ?Milus Banister, MD ?11/08/2021 9:57:48 AM ?This report has been signed electronically. ?

## 2021-11-08 NOTE — Progress Notes (Signed)
HPI: ?This is a man at routine risk for CRC ? ? ?ROS: complete GI ROS as described in HPI, all other review negative. ? ?Constitutional:  No unintentional weight loss ? ? ?History reviewed. No pertinent past medical history. ? ?Past Surgical History:  ?Procedure Laterality Date  ? KNEE SURGERY    ? ? ?No current outpatient medications on file.  ? ?Current Facility-Administered Medications  ?Medication Dose Route Frequency Provider Last Rate Last Admin  ? 0.9 %  sodium chloride infusion  500 mL Intravenous Continuous Rachael Fee, MD      ? ? ?Allergies as of 11/08/2021  ? (No Known Allergies)  ? ? ?Family History  ?Problem Relation Age of Onset  ? Colon cancer Neg Hx   ? Colon polyps Neg Hx   ? Esophageal cancer Neg Hx   ? Stomach cancer Neg Hx   ? Rectal cancer Neg Hx   ? ? ?Social History  ? ?Socioeconomic History  ? Marital status: Married  ?  Spouse name: Not on file  ? Number of children: Not on file  ? Years of education: Not on file  ? Highest education level: Not on file  ?Occupational History  ? Not on file  ?Tobacco Use  ? Smoking status: Never  ? Smokeless tobacco: Never  ? Tobacco comments:  ?  Dip in HS for very short period   ?Substance and Sexual Activity  ? Alcohol use: Yes  ?  Comment: several times a week  ? Drug use: Not on file  ? Sexual activity: Not on file  ?Other Topics Concern  ? Not on file  ?Social History Narrative  ? Not on file  ? ?Social Determinants of Health  ? ?Financial Resource Strain: Not on file  ?Food Insecurity: Not on file  ?Transportation Needs: Not on file  ?Physical Activity: Not on file  ?Stress: Not on file  ?Social Connections: Not on file  ?Intimate Partner Violence: Not on file  ? ? ? ?Physical Exam: ?BP 130/71   Pulse 63   Temp 98 ?F (36.7 ?C)   Ht 5\' 10"  (1.778 m)   Wt 186 lb (84.4 kg)   SpO2 98%   BMI 26.69 kg/m?  ?Constitutional: generally well-appearing ?Psychiatric: alert and oriented x3 ?Lungs: CTA bilaterally ?Heart: no MCR ? ?Assessment and  plan: ?46 y.o. male with routine risk for CRC ? ?Screening colonoscoyp today ? ?Care is appropriate for the ambulatory setting. ? ?54, MD ?[facility] ?11/08/2021, 9:29 AM ? ? ? ?

## 2021-11-08 NOTE — Patient Instructions (Signed)
No polyps!! Next colonoscopy 10 years ? ? ?YOU HAD AN ENDOSCOPIC PROCEDURE TODAY AT THE [facility]:   Refer to the procedure report that was given to you for any specific questions about what was found during the examination.  If the procedure report does not answer your questions, please call your gastroenterologist to clarify.  If you requested that your care partner not be given the details of your procedure findings, then the procedure report has been included in a sealed envelope for you to review at your convenience later. ? ?YOU SHOULD EXPECT: Some feelings of bloating in the abdomen. Passage of more gas than usual.  Walking can help get rid of the air that was put into your GI tract during the procedure and reduce the bloating. If you had a lower endoscopy (such as a colonoscopy or flexible sigmoidoscopy) you may notice spotting of blood in your stool or on the toilet paper. If you underwent a bowel prep for your procedure, you may not have a normal bowel movement for a few days. ? ?Please Note:  You might notice some irritation and congestion in your nose or some drainage.  This is from the oxygen used during your procedure.  There is no need for concern and it should clear up in a day or so. ? ?SYMPTOMS TO REPORT IMMEDIATELY: ? ?Following lower endoscopy (colonoscopy or flexible sigmoidoscopy): ? Excessive amounts of blood in the stool ? Significant tenderness or worsening of abdominal pains ? Swelling of the abdomen that is new, acute ? Fever of 100?F or higher ? ?For urgent or emergent issues, a gastroenterologist can be reached at any hour by calling [phone]. ?Do not use MyChart messaging for urgent concerns.  ? ? ?DIET:  We do recommend a small meal at first, but then you may proceed to your regular diet.  Drink plenty of fluids but you should avoid alcoholic beverages for 24 hours. ? ?ACTIVITY:  You should plan to take it easy for the rest of today and you should NOT DRIVE or use  heavy machinery until tomorrow (because of the sedation medicines used during the test).   ? ?FOLLOW UP: ?Our staff will call the number listed on your records 48-72 hours following your procedure to check on you and address any questions or concerns that you may have regarding the information given to you following your procedure. If we do not reach you, we will leave a message.  We will attempt to reach you two times.  During this call, we will ask if you have developed any symptoms of COVID 19. If you develop any symptoms (ie: fever, flu-like symptoms, shortness of breath, cough etc.) before then, please call [phone].  If you test positive for Covid 19 in the 2 weeks post procedure, please call and report this information to [place].   ? ? ?SIGNATURES/CONFIDENTIALITY: ?You and/or your care partner have signed paperwork which will be entered into your electronic medical record.  These signatures attest to the fact that that the information above on your After Visit Summary has been reviewed and is understood.  Full responsibility of the confidentiality of this discharge information lies with you and/or your care-partner. ? ?

## 2021-11-10 ENCOUNTER — Telehealth: Payer: Self-pay

## 2021-11-10 NOTE — Telephone Encounter (Signed)
Attempted to reach patient for post-procedure f/u call. No answer. Left message. ?

## 2021-11-10 NOTE — Telephone Encounter (Signed)
Attempted to reach patient for post-procedure f/u call (2nd attempt). No answer. Left message for him to please not hesitate to call us if he has any questions/concerns regarding his care. 

## 2022-02-06 DIAGNOSIS — M545 Low back pain, unspecified: Secondary | ICD-10-CM | POA: Diagnosis not present

## 2022-02-06 DIAGNOSIS — M9903 Segmental and somatic dysfunction of lumbar region: Secondary | ICD-10-CM | POA: Diagnosis not present

## 2022-02-06 DIAGNOSIS — M9905 Segmental and somatic dysfunction of pelvic region: Secondary | ICD-10-CM | POA: Diagnosis not present

## 2022-02-06 DIAGNOSIS — M9904 Segmental and somatic dysfunction of sacral region: Secondary | ICD-10-CM | POA: Diagnosis not present

## 2022-02-14 DIAGNOSIS — E7800 Pure hypercholesterolemia, unspecified: Secondary | ICD-10-CM | POA: Diagnosis not present

## 2022-02-22 DIAGNOSIS — M9905 Segmental and somatic dysfunction of pelvic region: Secondary | ICD-10-CM | POA: Diagnosis not present

## 2022-02-22 DIAGNOSIS — M545 Low back pain, unspecified: Secondary | ICD-10-CM | POA: Diagnosis not present

## 2022-02-22 DIAGNOSIS — M9903 Segmental and somatic dysfunction of lumbar region: Secondary | ICD-10-CM | POA: Diagnosis not present

## 2022-02-22 DIAGNOSIS — M9904 Segmental and somatic dysfunction of sacral region: Secondary | ICD-10-CM | POA: Diagnosis not present

## 2022-02-23 DIAGNOSIS — M9905 Segmental and somatic dysfunction of pelvic region: Secondary | ICD-10-CM | POA: Diagnosis not present

## 2022-02-23 DIAGNOSIS — M9903 Segmental and somatic dysfunction of lumbar region: Secondary | ICD-10-CM | POA: Diagnosis not present

## 2022-02-23 DIAGNOSIS — M9904 Segmental and somatic dysfunction of sacral region: Secondary | ICD-10-CM | POA: Diagnosis not present

## 2022-02-23 DIAGNOSIS — M545 Low back pain, unspecified: Secondary | ICD-10-CM | POA: Diagnosis not present

## 2022-03-02 DIAGNOSIS — M9904 Segmental and somatic dysfunction of sacral region: Secondary | ICD-10-CM | POA: Diagnosis not present

## 2022-03-02 DIAGNOSIS — M9903 Segmental and somatic dysfunction of lumbar region: Secondary | ICD-10-CM | POA: Diagnosis not present

## 2022-03-02 DIAGNOSIS — M545 Low back pain, unspecified: Secondary | ICD-10-CM | POA: Diagnosis not present

## 2022-03-02 DIAGNOSIS — M9905 Segmental and somatic dysfunction of pelvic region: Secondary | ICD-10-CM | POA: Diagnosis not present

## 2022-08-16 DIAGNOSIS — Z0000 Encounter for general adult medical examination without abnormal findings: Secondary | ICD-10-CM | POA: Diagnosis not present

## 2022-08-16 DIAGNOSIS — Z1159 Encounter for screening for other viral diseases: Secondary | ICD-10-CM | POA: Diagnosis not present

## 2022-08-16 DIAGNOSIS — E7800 Pure hypercholesterolemia, unspecified: Secondary | ICD-10-CM | POA: Diagnosis not present

## 2022-09-04 DIAGNOSIS — D485 Neoplasm of uncertain behavior of skin: Secondary | ICD-10-CM | POA: Diagnosis not present

## 2022-09-04 DIAGNOSIS — D2261 Melanocytic nevi of right upper limb, including shoulder: Secondary | ICD-10-CM | POA: Diagnosis not present

## 2022-09-04 DIAGNOSIS — D225 Melanocytic nevi of trunk: Secondary | ICD-10-CM | POA: Diagnosis not present

## 2022-09-04 DIAGNOSIS — D2239 Melanocytic nevi of other parts of face: Secondary | ICD-10-CM | POA: Diagnosis not present

## 2022-09-04 DIAGNOSIS — L821 Other seborrheic keratosis: Secondary | ICD-10-CM | POA: Diagnosis not present

## 2022-11-08 DIAGNOSIS — D485 Neoplasm of uncertain behavior of skin: Secondary | ICD-10-CM | POA: Diagnosis not present

## 2022-11-08 DIAGNOSIS — L988 Other specified disorders of the skin and subcutaneous tissue: Secondary | ICD-10-CM | POA: Diagnosis not present

## 2023-03-22 DIAGNOSIS — M25561 Pain in right knee: Secondary | ICD-10-CM | POA: Diagnosis not present

## 2023-07-09 DIAGNOSIS — M9905 Segmental and somatic dysfunction of pelvic region: Secondary | ICD-10-CM | POA: Diagnosis not present

## 2023-07-09 DIAGNOSIS — M62838 Other muscle spasm: Secondary | ICD-10-CM | POA: Diagnosis not present

## 2023-07-09 DIAGNOSIS — M9903 Segmental and somatic dysfunction of lumbar region: Secondary | ICD-10-CM | POA: Diagnosis not present

## 2023-07-09 DIAGNOSIS — M9904 Segmental and somatic dysfunction of sacral region: Secondary | ICD-10-CM | POA: Diagnosis not present

## 2023-07-12 DIAGNOSIS — M62838 Other muscle spasm: Secondary | ICD-10-CM | POA: Diagnosis not present

## 2023-07-12 DIAGNOSIS — M9905 Segmental and somatic dysfunction of pelvic region: Secondary | ICD-10-CM | POA: Diagnosis not present

## 2023-07-12 DIAGNOSIS — M9904 Segmental and somatic dysfunction of sacral region: Secondary | ICD-10-CM | POA: Diagnosis not present

## 2023-07-12 DIAGNOSIS — M9903 Segmental and somatic dysfunction of lumbar region: Secondary | ICD-10-CM | POA: Diagnosis not present

## 2023-08-20 DIAGNOSIS — Z130 Encounter for screening for diseases of the blood and blood-forming organs and certain disorders involving the immune mechanism: Secondary | ICD-10-CM | POA: Diagnosis not present

## 2023-08-20 DIAGNOSIS — Z0000 Encounter for general adult medical examination without abnormal findings: Secondary | ICD-10-CM | POA: Diagnosis not present

## 2023-08-20 DIAGNOSIS — E7800 Pure hypercholesterolemia, unspecified: Secondary | ICD-10-CM | POA: Diagnosis not present

## 2023-08-22 ENCOUNTER — Other Ambulatory Visit (HOSPITAL_COMMUNITY): Payer: Self-pay

## 2023-08-22 MED ORDER — ROSUVASTATIN CALCIUM 20 MG PO TABS: 20.0000 mg | ORAL_TABLET | Freq: Every day | ORAL | 1 refills | Status: DC

## 2023-09-06 DIAGNOSIS — D225 Melanocytic nevi of trunk: Secondary | ICD-10-CM | POA: Diagnosis not present

## 2023-09-06 DIAGNOSIS — D485 Neoplasm of uncertain behavior of skin: Secondary | ICD-10-CM | POA: Diagnosis not present

## 2023-09-06 DIAGNOSIS — L905 Scar conditions and fibrosis of skin: Secondary | ICD-10-CM | POA: Diagnosis not present

## 2023-09-06 DIAGNOSIS — D2261 Melanocytic nevi of right upper limb, including shoulder: Secondary | ICD-10-CM | POA: Diagnosis not present

## 2023-09-26 ENCOUNTER — Emergency Department (HOSPITAL_COMMUNITY)
Admission: EM | Admit: 2023-09-26 | Discharge: 2023-09-26 | Disposition: A | Discharge status: Home / Self Care | Attending: Emergency Medicine | Admitting: Emergency Medicine

## 2023-09-26 ENCOUNTER — Emergency Department (HOSPITAL_COMMUNITY)

## 2023-09-26 DIAGNOSIS — M7989 Other specified soft tissue disorders: Secondary | ICD-10-CM | POA: Diagnosis not present

## 2023-09-26 DIAGNOSIS — Y9339 Activity, other involving climbing, rappelling and jumping off: Secondary | ICD-10-CM | POA: Diagnosis not present

## 2023-09-26 DIAGNOSIS — W268XXA Contact with other sharp object(s), not elsewhere classified, initial encounter: Secondary | ICD-10-CM | POA: Insufficient documentation

## 2023-09-26 DIAGNOSIS — I10 Essential (primary) hypertension: Secondary | ICD-10-CM | POA: Diagnosis not present

## 2023-09-26 DIAGNOSIS — S81811A Laceration without foreign body, right lower leg, initial encounter: Secondary | ICD-10-CM | POA: Diagnosis not present

## 2023-09-26 DIAGNOSIS — S71111A Laceration without foreign body, right thigh, initial encounter: Secondary | ICD-10-CM | POA: Diagnosis not present

## 2023-09-26 DIAGNOSIS — S79929A Unspecified injury of unspecified thigh, initial encounter: Secondary | ICD-10-CM | POA: Diagnosis not present

## 2023-09-26 DIAGNOSIS — S81801A Unspecified open wound, right lower leg, initial encounter: Secondary | ICD-10-CM

## 2023-09-26 LAB — BASIC METABOLIC PANEL
Anion gap: 12 (ref 5–15)
BUN: 30 mg/dL — ABNORMAL HIGH (ref 6–20)
CO2: 20 mmol/L — ABNORMAL LOW (ref 22–32)
Calcium: 9 mg/dL (ref 8.9–10.3)
Chloride: 104 mmol/L (ref 98–111)
Creatinine, Ser: 0.89 mg/dL (ref 0.61–1.24)
GFR, Estimated: >60 mL/min (ref >60)
Glucose, Bld: 94 mg/dL (ref 70–99)
Potassium: 3.6 mmol/L (ref 3.5–5.1)
Sodium: 136 mmol/L (ref 135–145)

## 2023-09-26 LAB — CBC WITH DIFFERENTIAL/PLATELET
Abs Immature Granulocytes: 0.01 10*3/uL (ref 0.00–0.07)
Basophils Absolute: 0 10*3/uL (ref 0.0–0.1)
Basophils Relative: 0 %
Eosinophils Absolute: 0.1 10*3/uL (ref 0.0–0.5)
Eosinophils Relative: 2 %
HCT: 40.8 % (ref 39.0–52.0)
Hemoglobin: 14.2 g/dL (ref 13.0–17.0)
Immature Granulocytes: 0 %
Lymphocytes Relative: 29 %
Lymphs Abs: 1.8 10*3/uL (ref 0.7–4.0)
MCH: 32.2 pg (ref 26.0–34.0)
MCHC: 34.8 g/dL (ref 30.0–36.0)
MCV: 92.5 fL (ref 80.0–100.0)
Monocytes Absolute: 0.5 10*3/uL (ref 0.1–1.0)
Monocytes Relative: 7 %
Neutro Abs: 3.9 10*3/uL (ref 1.7–7.7)
Neutrophils Relative %: 62 %
Platelets: 179 10*3/uL (ref 150–400)
RBC: 4.41 MIL/uL (ref 4.22–5.81)
RDW: 11.6 % (ref 11.5–15.5)
WBC: 6.4 10*3/uL (ref 4.0–10.5)
nRBC: 0 % (ref 0.0–0.2)

## 2023-09-26 LAB — CK: Total CK: 310 U/L (ref 49–397)

## 2023-09-26 MED ORDER — LIDOCAINE HCL 2 % IJ SOLN
10.0000 mL | Freq: Once | INTRAMUSCULAR | Status: AC
  Administered 2023-09-26: 200 mg
  Filled 2023-09-26: qty 20

## 2023-09-26 MED ORDER — CEFAZOLIN SODIUM-DEXTROSE 2-4 GM/100ML-% IV SOLN
2.0000 g | Freq: Once | INTRAVENOUS | Status: AC
  Administered 2023-09-26: 2 g via INTRAVENOUS
  Filled 2023-09-26: qty 100

## 2023-09-26 MED ORDER — MORPHINE SULFATE (PF) 4 MG/ML IV SOLN
4.0000 mg | Freq: Once | INTRAVENOUS | Status: AC
  Administered 2023-09-26: 4 mg via INTRAVENOUS
  Filled 2023-09-26: qty 1

## 2023-09-26 MED ORDER — HYDROCODONE-ACETAMINOPHEN 5-325 MG PO TABS: 1.0000 | ORAL_TABLET | Freq: Four times a day (QID) | ORAL | 0 refills | Status: DC | PRN

## 2023-09-26 MED ORDER — AMOXICILLIN-POT CLAVULANATE 875-125 MG PO TABS: 1.0000 | ORAL_TABLET | Freq: Two times a day (BID) | ORAL | 0 refills | Status: DC

## 2023-09-26 NOTE — ED Provider Notes (Addendum)
 [place] EMERGENCY DEPARTMENT AT [facility] Provider Note   CSN: [id number] Arrival date & time: 09/26/23  1946     History  Chief Complaint  Patient presents with   Puncture Wound    Randy Quinn is a 48 y.o. male here presenting with right leg wound.  He states that he was trying to climb a fence to grab his tennis ball.  He states that when he tried to climb the fence, unfortunately the fence impaled his right leg.  He was noted to have a 2 inch laceration.  EMS was called and patient was sent here for further evaluation.  He states that his tetanus is up-to-date.  Patient denies any trauma or injury.  The history is provided by the patient.       Home Medications Prior to Admission medications   Medication Sig Start Date End Date Taking? Authorizing Provider  rosuvastatin (CRESTOR) 20 MG tablet Take 1 tablet (20 mg total) by mouth daily. 08/22/23         Allergies    Patient has no known allergies.    Review of Systems   Review of Systems  Skin:  Positive for wound.  All other systems reviewed and are negative.   Physical Exam Updated Vital Signs BP (!) 142/91 (BP Location: Right Arm)   Pulse 71   Temp 98.8 F (37.1 C) (Oral)   Resp 16   Ht 5\' 10"  (1.778 m)   Wt 86.2 kg   SpO2 95%   BMI 27.26 kg/m  Physical Exam Vitals and nursing note reviewed.  HENT:     Head: Normocephalic and atraumatic.     Nose: Nose normal.     Mouth/Throat:     Mouth: Mucous membranes are moist.  Eyes:     Extraocular Movements: Extraocular movements intact.     Pupils: Pupils are equal, round, and reactive to light.  Cardiovascular:     Rate and Rhythm: Normal rate.     Pulses: Normal pulses.  Pulmonary:     Effort: Pulmonary effort is normal.     Breath sounds: Normal breath sounds.  Abdominal:     General: Abdomen is flat.     Palpations: Abdomen is soft.  Musculoskeletal:     Cervical back: Normal range of motion.     Comments: Patient has a 10 cm  laceration of the right lower thigh area.  There is fat exposed.  There is some missing skin as well.  However I do not see any muscle layer exposed.  Patient has 2+ popliteal pulse and DP and PT pulses.  Patient is able to flex and extend the knee and plantarflex and dorsiflex the foot.  Skin:    General: Skin is warm.     Capillary Refill: Capillary refill takes less than 2 seconds.  Neurological:     General: No focal deficit present.     Mental Status: He is alert and oriented to person, place, and time.  Psychiatric:        Mood and Affect: Mood normal.        Behavior: Behavior normal.     ED Results / Procedures / Treatments   Labs (all labs ordered are listed, but only abnormal results are displayed) Labs Reviewed  BASIC METABOLIC PANEL - Abnormal; Notable for the following components:      Result Value   CO2 20 (*)    BUN 30 (*)    All other components within normal  limits  CBC WITH DIFFERENTIAL/PLATELET  CK    EKG None  Radiology No results found.  Procedures Procedures    LACERATION REPAIR Performed by: Richardean Canal Authorized by: Richardean Canal Consent: Verbal consent obtained. Risks and benefits: risks, benefits and alternatives were discussed Consent given by: patient Patient identity confirmed: provided demographic data Prepped and Draped in normal sterile fashion Wound explored  Laceration Location: R thigh  Laceration Length:10 cm  No Foreign Bodies seen or palpated  Anesthesia: local infiltration  Local anesthetic: lidocaine 2 % no epinephrine  Anesthetic total: 10 ml  Irrigation method: syringe Amount of cleaning: standard  Skin closure: multi layer   Number of sutures: 3 3-0 vicryl subcutaneous tissue, 3 3-0 ethilon simple interrupted   Technique: See above.  I purposely left some gaps to prevent infection and the wound was irrigated extensively  Patient tolerance: Patient tolerated the procedure well with no immediate  complications.    Medications Ordered in ED Medications  ceFAZolin (ANCEF) IVPB 2g/100 mL premix (2 g Intravenous New Bag/Given 09/26/23 2047)  morphine (PF) 4 MG/ML injection 4 mg (4 mg Intravenous Given 09/26/23 2022)  lidocaine (XYLOCAINE) 2 % (with pres) injection 200 mg (200 mg Infiltration Given 09/26/23 2024)    ED Course/ Medical Decision Making/ A&P                                 Medical Decision Making [name] is a 48 y.o. male who presented with right leg injury.  Patient has good peripheral pulses.  Will get x-ray to rule out fracture.  Will also get CK level.  Patient's compartments are soft.  Patient's tetanus is up-to-date so we will give Ancef and will likely need to suture the laceration.  9:17 PM Reviewed patient's labs and they were unremarkable.  CK level is normal.  I will explore the wound after numbing the patient.  I see subcutaneous tissue and no muscles exposed.  I irrigated the wound with 500 cc of normal saline I was able to suture the subcutaneous tissue.  Also did simple interrupted sutures and purposely left some gaps to prevent infection.  Patient received Ancef.  Will discharge home with Augmentin and pain medicine.  Given crutches.  Problems Addressed: Leg wound, right, initial encounter: acute illness or injury  Amount and/or Complexity of Data Reviewed Labs: ordered. Decision-making details documented in ED Course. Radiology: ordered and independent interpretation performed. Decision-making details documented in ED Course.  Risk Prescription drug management.    Final Clinical Impression(s) / ED Diagnoses Final diagnoses:  None    Rx / DC Orders ED Discharge Orders     None         Charlynne Pander, MD 09/26/23 2123    Charlynne Pander, MD 09/26/23 2151

## 2023-09-26 NOTE — Discharge Instructions (Signed)
 As we discussed, you have leg wound on the right leg.  Please change dressing daily.  I also recommend taking Augmentin twice a day for a week.  Take Tylenol or Motrin for pain and use crutches.  Take Vicodin for severe pain.  You need to have a wound check in 2 to 3 days.  If the wound is healing well, you may leave it open.  You need to have suture removal in a week  Return to ER if you have fever, purulent drainage, severe pain.

## 2023-09-26 NOTE — ED Triage Notes (Signed)
 Patient arrived via [facility] after jumping a fence and had a penetrating wound to right thigh. Bleeding is controlled. Reporting wound is 2 in wide 5 in deep per fire department.

## 2023-10-04 DIAGNOSIS — S81819A Laceration without foreign body, unspecified lower leg, initial encounter: Secondary | ICD-10-CM | POA: Diagnosis not present

## 2023-10-10 DIAGNOSIS — E7800 Pure hypercholesterolemia, unspecified: Secondary | ICD-10-CM | POA: Diagnosis not present

## 2023-10-10 DIAGNOSIS — S81819A Laceration without foreign body, unspecified lower leg, initial encounter: Secondary | ICD-10-CM | POA: Diagnosis not present

## 2023-10-18 DIAGNOSIS — D485 Neoplasm of uncertain behavior of skin: Secondary | ICD-10-CM | POA: Diagnosis not present

## 2023-10-18 DIAGNOSIS — D225 Melanocytic nevi of trunk: Secondary | ICD-10-CM | POA: Diagnosis not present

## 2023-11-01 DIAGNOSIS — M7542 Impingement syndrome of left shoulder: Secondary | ICD-10-CM | POA: Diagnosis not present

## 2023-11-06 ENCOUNTER — Other Ambulatory Visit (HOSPITAL_COMMUNITY): Payer: Self-pay

## 2023-11-20 ENCOUNTER — Observation Stay (HOSPITAL_BASED_OUTPATIENT_CLINIC_OR_DEPARTMENT_OTHER)
Admission: EM | Admit: 2023-11-20 | Discharge: 2023-11-23 | Disposition: A | Discharge status: Home / Self Care | Attending: Family Medicine | Admitting: Family Medicine

## 2023-11-20 ENCOUNTER — Emergency Department (HOSPITAL_BASED_OUTPATIENT_CLINIC_OR_DEPARTMENT_OTHER)

## 2023-11-20 ENCOUNTER — Other Ambulatory Visit: Payer: Self-pay

## 2023-11-20 ENCOUNTER — Encounter (HOSPITAL_BASED_OUTPATIENT_CLINIC_OR_DEPARTMENT_OTHER): Payer: Self-pay

## 2023-11-20 DIAGNOSIS — D696 Thrombocytopenia, unspecified: Secondary | ICD-10-CM | POA: Diagnosis not present

## 2023-11-20 DIAGNOSIS — Z1152 Encounter for screening for COVID-19: Secondary | ICD-10-CM | POA: Insufficient documentation

## 2023-11-20 DIAGNOSIS — J90 Pleural effusion, not elsewhere classified: Secondary | ICD-10-CM | POA: Diagnosis not present

## 2023-11-20 DIAGNOSIS — K5730 Diverticulosis of large intestine without perforation or abscess without bleeding: Secondary | ICD-10-CM | POA: Diagnosis not present

## 2023-11-20 DIAGNOSIS — R945 Abnormal results of liver function studies: Secondary | ICD-10-CM | POA: Diagnosis not present

## 2023-11-20 DIAGNOSIS — E785 Hyperlipidemia, unspecified: Secondary | ICD-10-CM | POA: Diagnosis not present

## 2023-11-20 DIAGNOSIS — R791 Abnormal coagulation profile: Secondary | ICD-10-CM | POA: Diagnosis not present

## 2023-11-20 DIAGNOSIS — R000 Tachycardia, unspecified: Secondary | ICD-10-CM | POA: Diagnosis not present

## 2023-11-20 DIAGNOSIS — Z79899 Other long term (current) drug therapy: Secondary | ICD-10-CM | POA: Diagnosis not present

## 2023-11-20 DIAGNOSIS — R509 Fever, unspecified: Secondary | ICD-10-CM | POA: Diagnosis not present

## 2023-11-20 DIAGNOSIS — R7401 Elevation of levels of liver transaminase levels: Secondary | ICD-10-CM | POA: Diagnosis not present

## 2023-11-20 DIAGNOSIS — R7989 Other specified abnormal findings of blood chemistry: Secondary | ICD-10-CM

## 2023-11-20 DIAGNOSIS — D709 Neutropenia, unspecified: Secondary | ICD-10-CM | POA: Diagnosis not present

## 2023-11-20 DIAGNOSIS — A419 Sepsis, unspecified organism: Secondary | ICD-10-CM | POA: Insufficient documentation

## 2023-11-20 DIAGNOSIS — R161 Splenomegaly, not elsewhere classified: Secondary | ICD-10-CM | POA: Diagnosis not present

## 2023-11-20 HISTORY — DX: Other benign neoplasm of skin, unspecified: D23.9

## 2023-11-20 HISTORY — DX: Hyperlipidemia, unspecified: E78.5

## 2023-11-20 LAB — CBC WITH DIFFERENTIAL/PLATELET
Abs Immature Granulocytes: 0.01 10*3/uL (ref 0.00–0.07)
Basophils Absolute: 0 10*3/uL (ref 0.0–0.1)
Basophils Relative: 1 %
Eosinophils Absolute: 0 10*3/uL (ref 0.0–0.5)
Eosinophils Relative: 0 %
HCT: 40.9 % (ref 39.0–52.0)
Hemoglobin: 14.7 g/dL (ref 13.0–17.0)
Immature Granulocytes: 1 %
Lymphocytes Relative: 27 %
Lymphs Abs: 0.3 10*3/uL — ABNORMAL LOW (ref 0.7–4.0)
MCH: 31.9 pg (ref 26.0–34.0)
MCHC: 35.9 g/dL (ref 30.0–36.0)
MCV: 88.7 fL (ref 80.0–100.0)
Monocytes Absolute: 0.1 10*3/uL (ref 0.1–1.0)
Monocytes Relative: 6 %
Neutro Abs: 0.6 10*3/uL — ABNORMAL LOW (ref 1.7–7.7)
Neutrophils Relative %: 65 %
Platelets: 57 10*3/uL — ABNORMAL LOW (ref 150–400)
RBC: 4.61 MIL/uL (ref 4.22–5.81)
RDW: 11.6 % (ref 11.5–15.5)
WBC: 1 10*3/uL — CL (ref 4.0–10.5)
nRBC: 0 % (ref 0.0–0.2)

## 2023-11-20 LAB — COMPREHENSIVE METABOLIC PANEL WITH GFR
ALT: 389 U/L — ABNORMAL HIGH (ref 0–44)
AST: 484 U/L — ABNORMAL HIGH (ref 15–41)
Albumin: 3.8 g/dL (ref 3.5–5.0)
Alkaline Phosphatase: 127 U/L — ABNORMAL HIGH (ref 38–126)
Anion gap: 9 (ref 5–15)
BUN: 12 mg/dL (ref 6–20)
CO2: 27 mmol/L (ref 22–32)
Calcium: 8.9 mg/dL (ref 8.9–10.3)
Chloride: 103 mmol/L (ref 98–111)
Creatinine, Ser: 1.14 mg/dL (ref 0.61–1.24)
GFR, Estimated: >60 mL/min (ref >60)
Glucose, Bld: 148 mg/dL — ABNORMAL HIGH (ref 70–99)
Potassium: 3.9 mmol/L (ref 3.5–5.1)
Sodium: 139 mmol/L (ref 135–145)
Total Bilirubin: 0.8 mg/dL (ref 0.0–1.2)
Total Protein: 6 g/dL — ABNORMAL LOW (ref 6.5–8.1)

## 2023-11-20 LAB — URINALYSIS, ROUTINE W REFLEX MICROSCOPIC
Bacteria, UA: NONE SEEN
Bilirubin Urine: NEGATIVE
Glucose, UA: NEGATIVE mg/dL
Hgb urine dipstick: NEGATIVE
Ketones, ur: NEGATIVE mg/dL
Leukocytes,Ua: NEGATIVE
Nitrite: NEGATIVE
Protein, ur: 30 mg/dL — AB
Specific Gravity, Urine: >1.046 — ABNORMAL HIGH (ref 1.005–1.030)
pH: 7 (ref 5.0–8.0)

## 2023-11-20 MED ORDER — VANCOMYCIN HCL IN DEXTROSE 1-5 GM/200ML-% IV SOLN
1000.0000 mg | Freq: Once | INTRAVENOUS | Status: AC
  Administered 2023-11-21: 1000 mg via INTRAVENOUS

## 2023-11-20 MED ORDER — PIPERACILLIN-TAZOBACTAM 3.375 G IVPB 30 MIN
3.3750 g | Freq: Once | INTRAVENOUS | Status: AC
  Administered 2023-11-20: 3.375 g via INTRAVENOUS
  Filled 2023-11-20: qty 50

## 2023-11-20 MED ORDER — IOHEXOL 300 MG/ML  SOLN
100.0000 mL | Freq: Once | INTRAMUSCULAR | Status: AC | PRN
  Administered 2023-11-20: 100 mL via INTRAVENOUS

## 2023-11-20 MED ORDER — VANCOMYCIN HCL IN DEXTROSE 1-5 GM/200ML-% IV SOLN
1000.0000 mg | Freq: Once | INTRAVENOUS | Status: AC
  Administered 2023-11-21: 1000 mg via INTRAVENOUS
  Filled 2023-11-20: qty 200

## 2023-11-20 NOTE — ED Triage Notes (Signed)
 Pt complaining of running a fever since Saturday. Wbc are 1 and platlets are low as well. 99.9 fever at home.

## 2023-11-20 NOTE — ED Notes (Signed)
 ED Provider at bedside.

## 2023-11-20 NOTE — Progress Notes (Signed)
 ED Pharmacy Antibiotic Sign Off An antibiotic consult was received from an ED provider for zosyn and vancomycin per pharmacy dosing for febrile neutropenia. A chart review was completed to assess appropriateness.   The following one time order(s) were placed:  Zosyn 3.375mg  x1  Vancomycin 2000mg  x1   Further antibiotic and/or antibiotic pharmacy consults should be ordered by the admitting provider if indicated.   Thank you for allowing pharmacy to be a part of this patient's care.   Mamie Searles, PharmD, BCCCP  Clinical Pharmacist 11/20/23 11:29 PM

## 2023-11-21 DIAGNOSIS — R791 Abnormal coagulation profile: Secondary | ICD-10-CM | POA: Diagnosis not present

## 2023-11-21 DIAGNOSIS — R7401 Elevation of levels of liver transaminase levels: Secondary | ICD-10-CM | POA: Diagnosis not present

## 2023-11-21 DIAGNOSIS — Z79899 Other long term (current) drug therapy: Secondary | ICD-10-CM | POA: Diagnosis not present

## 2023-11-21 DIAGNOSIS — R509 Fever, unspecified: Secondary | ICD-10-CM | POA: Diagnosis not present

## 2023-11-21 DIAGNOSIS — R5081 Fever presenting with conditions classified elsewhere: Secondary | ICD-10-CM

## 2023-11-21 DIAGNOSIS — Z1152 Encounter for screening for COVID-19: Secondary | ICD-10-CM | POA: Diagnosis not present

## 2023-11-21 DIAGNOSIS — D696 Thrombocytopenia, unspecified: Secondary | ICD-10-CM | POA: Diagnosis not present

## 2023-11-21 DIAGNOSIS — A419 Sepsis, unspecified organism: Secondary | ICD-10-CM | POA: Diagnosis not present

## 2023-11-21 DIAGNOSIS — D709 Neutropenia, unspecified: Secondary | ICD-10-CM

## 2023-11-21 DIAGNOSIS — E785 Hyperlipidemia, unspecified: Secondary | ICD-10-CM | POA: Diagnosis not present

## 2023-11-21 LAB — COMPREHENSIVE METABOLIC PANEL WITH GFR
ALT: 353 U/L — ABNORMAL HIGH (ref 0–44)
AST: 330 U/L — ABNORMAL HIGH (ref 15–41)
Albumin: 3 g/dL — ABNORMAL LOW (ref 3.5–5.0)
Alkaline Phosphatase: 121 U/L (ref 38–126)
Anion gap: 9 (ref 5–15)
BUN: 10 mg/dL (ref 6–20)
CO2: 24 mmol/L (ref 22–32)
Calcium: 8 mg/dL — ABNORMAL LOW (ref 8.9–10.3)
Chloride: 104 mmol/L (ref 98–111)
Creatinine, Ser: 0.78 mg/dL (ref 0.61–1.24)
GFR, Estimated: >60 mL/min (ref >60)
Glucose, Bld: 139 mg/dL — ABNORMAL HIGH (ref 70–99)
Potassium: 3.7 mmol/L (ref 3.5–5.1)
Sodium: 137 mmol/L (ref 135–145)
Total Bilirubin: 1 mg/dL (ref 0.0–1.2)
Total Protein: 5.6 g/dL — ABNORMAL LOW (ref 6.5–8.1)

## 2023-11-21 LAB — MONONUCLEOSIS SCREEN: Mono Screen: NEGATIVE

## 2023-11-21 LAB — BLOOD GAS, VENOUS
Acid-Base Excess: 2.2 mmol/L — ABNORMAL HIGH (ref 0.0–2.0)
Bicarbonate: 27.3 mmol/L (ref 20.0–28.0)
O2 Saturation: 61.7 %
Patient temperature: 39
pCO2, Ven: 47 mmHg (ref 44–60)
pH, Ven: 7.38 (ref 7.25–7.43)
pO2, Ven: 37 mmHg (ref 32–45)

## 2023-11-21 LAB — CBC WITH DIFFERENTIAL/PLATELET
Abs Immature Granulocytes: 0.01 10*3/uL (ref 0.00–0.07)
Basophils Absolute: 0 10*3/uL (ref 0.0–0.1)
Basophils Relative: 1 %
Eosinophils Absolute: 0 10*3/uL (ref 0.0–0.5)
Eosinophils Relative: 0 %
HCT: 41.4 % (ref 39.0–52.0)
Hemoglobin: 14.4 g/dL (ref 13.0–17.0)
Immature Granulocytes: 1 %
Lymphocytes Relative: 46 %
Lymphs Abs: 0.9 10*3/uL (ref 0.7–4.0)
MCH: 31.7 pg (ref 26.0–34.0)
MCHC: 34.8 g/dL (ref 30.0–36.0)
MCV: 91.2 fL (ref 80.0–100.0)
Monocytes Absolute: 0.1 10*3/uL (ref 0.1–1.0)
Monocytes Relative: 5 %
Neutro Abs: 0.9 10*3/uL — ABNORMAL LOW (ref 1.7–7.7)
Neutrophils Relative %: 47 %
Platelets: 58 10*3/uL — ABNORMAL LOW (ref 150–400)
RBC: 4.54 MIL/uL (ref 4.22–5.81)
RDW: 11.9 % (ref 11.5–15.5)
Smear Review: DECREASED
WBC: 1.9 10*3/uL — ABNORMAL LOW (ref 4.0–10.5)
nRBC: 0 % (ref 0.0–0.2)

## 2023-11-21 LAB — DIFFERENTIAL
Abs Immature Granulocytes: 0.01 10*3/uL (ref 0.00–0.07)
Basophils Absolute: 0 10*3/uL (ref 0.0–0.1)
Basophils Relative: 1 %
Eosinophils Absolute: 0 10*3/uL (ref 0.0–0.5)
Eosinophils Relative: 0 %
Immature Granulocytes: 1 %
Lymphocytes Relative: 42 %
Lymphs Abs: 0.6 10*3/uL — ABNORMAL LOW (ref 0.7–4.0)
Monocytes Absolute: 0.1 10*3/uL (ref 0.1–1.0)
Monocytes Relative: 7 %
Neutro Abs: 0.7 10*3/uL — ABNORMAL LOW (ref 1.7–7.7)
Neutrophils Relative %: 49 %

## 2023-11-21 LAB — ACETAMINOPHEN LEVEL
Acetaminophen (Tylenol), Serum: <10 ug/mL — ABNORMAL LOW (ref 10–30)
Acetaminophen (Tylenol), Serum: <10 ug/mL — ABNORMAL LOW (ref 10–30)
Acetaminophen (Tylenol), Serum: <10 ug/mL — ABNORMAL LOW (ref 10–30)

## 2023-11-21 LAB — C-REACTIVE PROTEIN: CRP: 9.6 mg/dL — ABNORMAL HIGH (ref <1.0)

## 2023-11-21 LAB — RESPIRATORY PANEL BY PCR

## 2023-11-21 LAB — DIC (DISSEMINATED INTRAVASCULAR COAGULATION)PANEL
D-Dimer, Quant: 10.91 ug{FEU}/mL — ABNORMAL HIGH (ref 0.00–0.50)
D-Dimer, Quant: 9.9 ug{FEU}/mL — ABNORMAL HIGH (ref 0.00–0.50)
Fibrinogen: 289 mg/dL (ref 210–475)
Fibrinogen: 250 mg/dL (ref 210–475)
INR: 1 (ref 0.8–1.2)
INR: 1 (ref 0.8–1.2)
Platelets: 57 10*3/uL — ABNORMAL LOW (ref 150–400)
Platelets: 50 10*3/uL — ABNORMAL LOW (ref 150–400)
Prothrombin Time: 13.2 s (ref 11.4–15.2)
Prothrombin Time: 13.2 s (ref 11.4–15.2)
Smear Review: NONE SEEN
Smear Review: NONE SEEN
aPTT: 32 s (ref 24–36)
aPTT: 32 s (ref 24–36)

## 2023-11-21 LAB — RESP PANEL BY RT-PCR (RSV, FLU A&B, COVID)  RVPGX2
Influenza A by PCR: NEGATIVE
Influenza B by PCR: NEGATIVE
Resp Syncytial Virus by PCR: NEGATIVE
SARS Coronavirus 2 by RT PCR: NEGATIVE

## 2023-11-21 LAB — MRSA NEXT GEN BY PCR, NASAL: MRSA by PCR Next Gen: NOT DETECTED

## 2023-11-21 LAB — HEPATITIS PANEL, ACUTE
HCV Ab: NONREACTIVE
Hep A IgM: NONREACTIVE
Hep B C IgM: NONREACTIVE
Hepatitis B Surface Ag: NONREACTIVE

## 2023-11-21 LAB — CK TOTAL AND CKMB (NOT AT ARMC)
CK, MB: 0.5 ng/mL (ref 0.5–5.0)
Total CK: 69 U/L (ref 49–397)

## 2023-11-21 LAB — CBC
HCT: 37.5 % — ABNORMAL LOW (ref 39.0–52.0)
Hemoglobin: 13.5 g/dL (ref 13.0–17.0)
MCH: 32.2 pg (ref 26.0–34.0)
MCHC: 36 g/dL (ref 30.0–36.0)
MCV: 89.5 fL (ref 80.0–100.0)
Platelets: 51 10*3/uL — ABNORMAL LOW (ref 150–400)
RBC: 4.19 MIL/uL — ABNORMAL LOW (ref 4.22–5.81)
RDW: 11.7 % (ref 11.5–15.5)
WBC: 1.5 10*3/uL — ABNORMAL LOW (ref 4.0–10.5)
nRBC: 0 % (ref 0.0–0.2)

## 2023-11-21 LAB — HIV ANTIBODY (ROUTINE TESTING W REFLEX): HIV Screen 4th Generation wRfx: NONREACTIVE

## 2023-11-21 LAB — PHOSPHORUS: Phosphorus: 2.3 mg/dL — ABNORMAL LOW (ref 2.5–4.6)

## 2023-11-21 LAB — SEDIMENTATION RATE: Sed Rate: 5 mm/h (ref 0–16)

## 2023-11-21 LAB — LACTIC ACID, PLASMA
Lactic Acid, Venous: 0.9 mmol/L (ref 0.5–1.9)
Lactic Acid, Venous: 1.5 mmol/L (ref 0.5–1.9)

## 2023-11-21 LAB — SALICYLATE LEVEL: Salicylate Lvl: <7 mg/dL — ABNORMAL LOW (ref 7.0–30.0)

## 2023-11-21 LAB — MAGNESIUM: Magnesium: 1.8 mg/dL (ref 1.7–2.4)

## 2023-11-21 LAB — PROCALCITONIN: Procalcitonin: 0.3 ng/mL

## 2023-11-21 MED ORDER — ALBUTEROL SULFATE (2.5 MG/3ML) 0.083% IN NEBU: 2.5000 mg | INHALATION_SOLUTION | RESPIRATORY_TRACT | Status: DC | PRN

## 2023-11-21 MED ORDER — DOXYCYCLINE HYCLATE 100 MG PO TABS
100.0000 mg | ORAL_TABLET | Freq: Once | ORAL | Status: AC
  Administered 2023-11-21: 100 mg via ORAL
  Filled 2023-11-21: qty 1

## 2023-11-21 MED ORDER — VANCOMYCIN HCL 750 MG IV SOLR
750.0000 mg | Freq: Three times a day (TID) | INTRAVENOUS | Status: AC
  Administered 2023-11-21: 750 mg via INTRAVENOUS
  Filled 2023-11-21: qty 15

## 2023-11-21 MED ORDER — IBUPROFEN 800 MG PO TABS
800.0000 mg | ORAL_TABLET | Freq: Once | ORAL | Status: AC
  Administered 2023-11-21: 800 mg via ORAL
  Filled 2023-11-21: qty 1

## 2023-11-21 MED ORDER — VANCOMYCIN HCL 750 MG/150ML IV SOLN
750.0000 mg | Freq: Three times a day (TID) | INTRAVENOUS | Status: DC
  Administered 2023-11-21 – 2023-11-22 (×3): 750 mg via INTRAVENOUS
  Filled 2023-11-21 (×5): qty 150

## 2023-11-21 MED ORDER — TRAZODONE HCL 50 MG PO TABS: 50.0000 mg | ORAL_TABLET | Freq: Every evening | ORAL | Status: DC | PRN

## 2023-11-21 MED ORDER — PIPERACILLIN-TAZOBACTAM 3.375 G IVPB
3.3750 g | Freq: Three times a day (TID) | INTRAVENOUS | Status: DC
  Administered 2023-11-21 – 2023-11-22 (×4): 3.375 g via INTRAVENOUS
  Filled 2023-11-21 (×4): qty 50

## 2023-11-21 MED ORDER — DOXYCYCLINE HYCLATE 100 MG PO TABS
100.0000 mg | ORAL_TABLET | Freq: Two times a day (BID) | ORAL | Status: DC
  Administered 2023-11-21 – 2023-11-23 (×4): 100 mg via ORAL
  Filled 2023-11-21 (×4): qty 1

## 2023-11-21 MED ORDER — IBUPROFEN 200 MG PO TABS
400.0000 mg | ORAL_TABLET | Freq: Four times a day (QID) | ORAL | Status: DC | PRN
  Administered 2023-11-21 – 2023-11-22 (×3): 400 mg via ORAL
  Filled 2023-11-21 (×3): qty 2

## 2023-11-21 MED ORDER — CHLORHEXIDINE GLUCONATE CLOTH 2 % EX PADS
6.0000 | MEDICATED_PAD | Freq: Every day | CUTANEOUS | Status: DC
Start: 2023-11-21 — End: 2023-11-23
  Administered 2023-11-21 – 2023-11-22 (×2): 6 via TOPICAL

## 2023-11-21 MED ORDER — ONDANSETRON HCL 4 MG/2ML IJ SOLN
4.0000 mg | Freq: Four times a day (QID) | INTRAMUSCULAR | Status: DC | PRN
  Administered 2023-11-21: 4 mg via INTRAVENOUS
  Filled 2023-11-21: qty 2

## 2023-11-21 MED ORDER — LACTATED RINGERS IV BOLUS
500.0000 mL | Freq: Once | INTRAVENOUS | Status: AC
  Administered 2023-11-21: 500 mL via INTRAVENOUS

## 2023-11-21 MED ORDER — LACTATED RINGERS IV SOLN: INTRAVENOUS | Status: DC

## 2023-11-21 MED ORDER — ONDANSETRON HCL 4 MG PO TABS
4.0000 mg | ORAL_TABLET | Freq: Four times a day (QID) | ORAL | Status: DC | PRN
Start: 2023-11-21 — End: 2023-11-23

## 2023-11-21 NOTE — ED Notes (Signed)
[place] with  cl called for transport 

## 2023-11-21 NOTE — ED Notes (Signed)
 Patient's wife requested provider to be at bedside d/t patients color changing since last night. Patient resting comfortably. VSS. Wife expressed concerned about the potential for RMSF and mono.

## 2023-11-21 NOTE — ED Provider Notes (Signed)
 [place] EMERGENCY DEPARTMENT AT [facility] Provider Note   CSN: [id number] Arrival date & time: 11/20/23  2025     History  Chief Complaint  Patient presents with   Fever    Randy Quinn is a 48 y.o. male.  With no significant past medical history presents to the ED for fever.  Patient reports ongoing fever at home over the last 4 days.  Tmax of 103.  Has been taking Tylenol  Motrin but fever returns after these medications wear off.  Aside from the fever he has no other infectious symptoms.  Denies shortness of breath,, coughing, congestion, chest pain, nausea, vomiting, diarrhea abdominal pain and urinary symptoms.  Recently seen for laceration sustained to the right lower extremity but this site is healing well.   Fever      Home Medications Prior to Admission medications   Medication Sig Start Date End Date Taking? Authorizing Provider  rosuvastatin  (CRESTOR ) 20 MG tablet Take 1 tablet (20 mg total) by mouth daily. 08/22/23  Yes   amoxicillin -clavulanate (AUGMENTIN ) 875-125 MG tablet Take 1 tablet by mouth every 12 (twelve) hours. 09/26/23   Dalene Duck, MD  HYDROcodone -acetaminophen  (NORCO/VICODIN) 5-325 MG tablet Take 1 tablet by mouth every 6 (six) hours as needed. 09/26/23   Dalene Duck, MD      Allergies    Patient has no known allergies.    Review of Systems   Review of Systems  Constitutional:  Positive for fever.    Physical Exam Updated Vital Signs BP 107/66   Pulse 70   Temp 98.9 F (37.2 C) (Oral)   Resp (!) 21   Ht 5\' 10"  (1.778 m)   Wt 86.2 kg   SpO2 95%   BMI 27.26 kg/m  Physical Exam Vitals and nursing note reviewed.  HENT:     Head: Normocephalic and atraumatic.  Eyes:     Pupils: Pupils are equal, round, and reactive to light.  Cardiovascular:     Rate and Rhythm: Normal rate and regular rhythm.  Pulmonary:     Effort: Pulmonary effort is normal.     Breath sounds: Normal breath sounds.  Abdominal:      Palpations: Abdomen is soft.     Tenderness: There is no abdominal tenderness.  Skin:    General: Skin is warm and dry.  Neurological:     Mental Status: He is alert.  Psychiatric:        Mood and Affect: Mood normal.     ED Results / Procedures / Treatments   Labs (all labs ordered are listed, but only abnormal results are displayed) Labs Reviewed  CBC WITH DIFFERENTIAL/PLATELET - Abnormal; Notable for the following components:      Result Value   WBC 1.0 (*)    Platelets 57 (*)    Neutro Abs 0.6 (*)    Lymphs Abs 0.3 (*)    All other components within normal limits  COMPREHENSIVE METABOLIC PANEL WITH GFR - Abnormal; Notable for the following components:   Glucose, Bld 148 (*)    Total Protein 6.0 (*)    AST 484 (*)    ALT 389 (*)    Alkaline Phosphatase 127 (*)    All other components within normal limits  URINALYSIS, ROUTINE W REFLEX MICROSCOPIC - Abnormal; Notable for the following components:   Specific Gravity, Urine >1.046 (*)    Protein, ur 30 (*)    All other components within normal limits  CULTURE, BLOOD (ROUTINE X  2)  CULTURE, BLOOD (ROUTINE X 2)  LACTIC ACID, PLASMA    EKG EKG Interpretation Date/Time:  Tuesday Nov 20 2023 22:08:35 EDT Ventricular Rate:  72 PR Interval:  160 QRS Duration:  105 QT Interval:  387 QTC Calculation: 424 R Axis:   86  Text Interpretation: Sinus rhythm RSR' in V1 or V2, right VCD or RVH Borderline T abnormalities, diffuse leads Baseline wander in lead(s) II III aVF V5 V6 Confirmed by Rafael Bun [phone]) on 11/20/2023 11:52:28 PM  Radiology CT CHEST ABDOMEN PELVIS W CONTRAST Result Date: 11/20/2023 CLINICAL DATA:  Febrile neutropenia.  Elevated LFTs. EXAM: CT CHEST, ABDOMEN, AND PELVIS WITH CONTRAST TECHNIQUE: Multidetector CT imaging of the chest, abdomen and pelvis was performed following the standard protocol during bolus administration of intravenous contrast. RADIATION DOSE REDUCTION: This exam was performed according to  the departmental dose-optimization program which includes automated exposure control, adjustment of the mA and/or kV according to patient size and/or use of iterative reconstruction technique. CONTRAST:  100mL OMNIPAQUE IOHEXOL 300 MG/ML  SOLN COMPARISON:  None Available. FINDINGS: CT CHEST FINDINGS Cardiovascular: No significant vascular findings. Normal heart size. No pericardial effusion. Mediastinum/Nodes: There is some wall thickening of the distal esophagus. Esophagus is nondilated. There are no enlarged mediastinal or hilar lymph nodes. Visualized thyroid gland is within normal limits. Lungs/Pleura: There is a small amount of atelectasis in both lung bases. The lungs are otherwise clear. There are trace bilateral pleural effusions. Musculoskeletal: No chest wall mass or suspicious bone lesions identified. CT ABDOMEN PELVIS FINDINGS Hepatobiliary: There is a subcentimeter hypodensity in the left lobe of the liver image 2/60 which is too small to characterize. The liver is mildly heterogeneous, but normal in size. The gallbladder and bile ducts are within normal limits. Pancreas: Unremarkable. No pancreatic ductal dilatation or surrounding inflammatory changes. Spleen: Mildly enlarged. Adrenals/Urinary Tract: There is a subcentimeter hypodensity in the right kidney which is too small to characterize, likely a cyst. Otherwise, the adrenal glands, kidneys and bladder are within normal limits. Stomach/Bowel: Stomach is within normal limits. Appendix appears normal. No evidence of bowel wall thickening, distention, or inflammatory changes. There is sigmoid colon diverticulosis. Vascular/Lymphatic: Aortic atherosclerosis. No enlarged abdominal or pelvic lymph nodes. Reproductive: Prostate is unremarkable. Other: There is a small amount of free fluid in the pelvis. There is no focal abdominal wall hernia. Musculoskeletal: No acute or significant osseous findings. IMPRESSION: 1. Trace bilateral pleural effusions with  adjacent atelectasis. 2. Mild wall thickening of the distal esophagus, nonspecific. 3. Mild heterogeneity of the liver, nonspecific. 4. Mild splenomegaly. 5. Small amount of free fluid in the pelvis. 6. Sigmoid colon diverticulosis. 7. Aortic atherosclerosis. Aortic Atherosclerosis (ICD10-I70.0). Electronically Signed   By: Tyron Gallon M.D.   On: 11/20/2023 22:35    Procedures Procedures    Medications Ordered in ED Medications  vancomycin (VANCOCIN) IVPB 1000 mg/200 mL premix (has no administration in time range)    Followed by  vancomycin (VANCOCIN) IVPB 1000 mg/200 mL premix (1,000 mg Intravenous New Bag/Given 11/21/23 0012)  iohexol (OMNIPAQUE) 300 MG/ML solution 100 mL (100 mLs Intravenous Contrast Given 11/20/23 2215)  piperacillin-tazobactam (ZOSYN) IVPB 3.375 g (0 g Intravenous Stopped 11/21/23 0013)    ED Course/ Medical Decision Making/ A&P Clinical Course as of 11/21/23 0031  Tue Nov 20, 2023  2137 Tear Drop Cells: PRESENT [MP]    Clinical Course User Index [MP] Sallyanne Creamer, DO  Medical Decision Making 48 year old male with history as above presenting for fever of unknown etiology over the last 4 days.  Aside from the fever has no other infectious symptoms.  No neck pain photophobia or clinical picture that would be concerning for meningitis.  Benign physical and neurologic exam.  Initial laboratory workup revealed new onset leukopenia with neutropenia, thrombocytopenia and elevation in his LFTs.  These are all new when compared to laboratory workup from 1 month ago.  His findings are most concerning for new hematologic/oncologic process.  Given reported fever in the setting of neutropenia will obtain blood cultures venous lactate here and cover with broad-spectrum antibiotics.  CT chest abdomen pelvis shows several nonspecific findings without clear cause of fever or laboratory abnormalities.  Patient will certainly require more workup during his  admission.  I will make hematology oncology aware of his admission to [facility] and need for further workup.  Discussed with admitting hospitalist who accepts patient for admission  Amount and/or Complexity of Data Reviewed Labs: ordered. Decision-making details documented in ED Course. Radiology: ordered.  Risk Prescription drug management. Decision regarding hospitalization.           Final Clinical Impression(s) / ED Diagnoses Final diagnoses:  Neutropenia, unspecified type (HCC)  Thrombocytopenia (HCC)  Fever, unspecified fever cause  Elevated liver function tests    Rx / DC Orders ED Discharge Orders     None         Sallyanne Creamer, DO 11/21/23 0032

## 2023-11-21 NOTE — H&P (Signed)
 History and Physical  Randy Quinn DOB: 11-15-1975 DOA: 11/20/2023  PCP: Benedetto Brady, MD   Chief Complaint: Fever  HPI: Randy Quinn is a 48 y.o. male with medical history significant for melanotic nevi of the trunk, hyperlipidemia, recent treatment with Augmentin  for laceration and cellulitis March 2025 being admitted to the hospital with newly recognized neutropenia and thrombocytopenia with fever.  Patient states he has been having ongoing fever at home for the last 4 to 5 days, with Tmax of 103.  Denies any shortness of breath, cough, sick contacts, chest pain, nausea, vomiting, diarrhea, abdominal pain, or any urinary symptoms.  Tested negative for COVID in his PCPs office.  As detailed below, workup in the ER overnight has been unremarkable except for finding of new neutropenia.  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  Denies any rashes on the skin, confusion.  Review of Systems: Please see HPI for pertinent positives and negatives. A complete 10 system review of systems are otherwise negative.  Past Medical History:  Diagnosis Date   Dysplastic nevi    Hyperlipidemia    Past Surgical History:  Procedure Laterality Date   KNEE SURGERY     Social History:  reports that he has never smoked. He has never used smokeless tobacco. He reports current alcohol use. He reports that he does not use drugs.  No Known Allergies  Family History  Problem Relation Age of Onset   Colon cancer Neg Hx    Colon polyps Neg Hx    Esophageal cancer Neg Hx    Stomach cancer Neg Hx    Rectal cancer Neg Hx      Prior to Admission medications   Medication Sig Start Date End Date Taking? Authorizing Provider  acetaminophen  (TYLENOL ) 325 MG tablet Take 325 mg by mouth as needed.   Yes [provider]  ibuprofen (ADVIL) 200 MG tablet Take 200 mg by mouth as needed.   Yes [provider]  rosuvastatin   (CRESTOR ) 20 MG tablet Take 1 tablet (20 mg total) by mouth daily. 08/22/23  Yes     Physical Exam: BP 121/70 (BP Location: Left Arm)   Pulse 81   Temp 98.9 F (37.2 C) (Oral)   Resp 18   Ht 5\' 10"  (1.778 m)   Wt 86.2 kg   SpO2 98%   BMI 27.26 kg/m  General:  Alert, oriented, calm, in no acute distress  Eyes: EOMI, clear conjuctivae, white sclerea Neck: supple, no masses, trachea mildline  Cardiovascular: RRR, no murmurs or rubs, no peripheral edema  Respiratory: clear to auscultation bilaterally, no wheezes, no crackles  Abdomen: soft, nontender, nondistended, normal bowel tones heard  Skin: dry, no rashes right inner thigh with well-healed laceration without evidence of ongoing infection Musculoskeletal: no joint effusions, normal range of motion  Psychiatric: appropriate affect, normal speech  Neurologic: extraocular muscles intact, clear speech, moving all extremities with intact sensorium         Labs on Admission:  Basic Metabolic Panel: Recent Labs  Lab 11/20/23 2050  NA 139  K 3.9  CL 103  CO2 27  GLUCOSE 148*  BUN 12  CREATININE 1.14  CALCIUM  8.9   Liver Function Tests: Recent Labs  Lab 11/20/23 2050  AST 484*  ALT 389*  ALKPHOS 127*  BILITOT 0.8  PROT 6.0*  ALBUMIN 3.8   No results for input(s): "LIPASE", "AMYLASE" in the last  168 hours. No results for input(s): "AMMONIA" in the last 168 hours. CBC: Recent Labs  Lab 11/20/23 2050  WBC 1.0*  NEUTROABS 0.6*  HGB 14.7  HCT 40.9  MCV 88.7  PLT 57*   Cardiac Enzymes: No results for input(s): "CKTOTAL", "CKMB", "CKMBINDEX", "TROPONINI" in the last 168 hours. BNP (last 3 results) No results for input(s): "BNP" in the last 8760 hours.  ProBNP (last 3 results) No results for input(s): "PROBNP" in the last 8760 hours.  CBG: No results for input(s): "GLUCAP" in the last 168 hours.  Radiological Exams on Admission: CT CHEST ABDOMEN PELVIS W CONTRAST Result Date: 11/20/2023 CLINICAL DATA:   Febrile neutropenia.  Elevated LFTs. EXAM: CT CHEST, ABDOMEN, AND PELVIS WITH CONTRAST TECHNIQUE: Multidetector CT imaging of the chest, abdomen and pelvis was performed following the standard protocol during bolus administration of intravenous contrast. RADIATION DOSE REDUCTION: This exam was performed according to the departmental dose-optimization program which includes automated exposure control, adjustment of the mA and/or kV according to patient size and/or use of iterative reconstruction technique. CONTRAST:  100mL OMNIPAQUE IOHEXOL 300 MG/ML  SOLN COMPARISON:  None Available. FINDINGS: CT CHEST FINDINGS Cardiovascular: No significant vascular findings. Normal heart size. No pericardial effusion. Mediastinum/Nodes: There is some wall thickening of the distal esophagus. Esophagus is nondilated. There are no enlarged mediastinal or hilar lymph nodes. Visualized thyroid gland is within normal limits. Lungs/Pleura: There is a small amount of atelectasis in both lung bases. The lungs are otherwise clear. There are trace bilateral pleural effusions. Musculoskeletal: No chest wall mass or suspicious bone lesions identified. CT ABDOMEN PELVIS FINDINGS Hepatobiliary: There is a subcentimeter hypodensity in the left lobe of the liver image 2/60 which is too small to characterize. The liver is mildly heterogeneous, but normal in size. The gallbladder and bile ducts are within normal limits. Pancreas: Unremarkable. No pancreatic ductal dilatation or surrounding inflammatory changes. Spleen: Mildly enlarged. Adrenals/Urinary Tract: There is a subcentimeter hypodensity in the right kidney which is too small to characterize, likely a cyst. Otherwise, the adrenal glands, kidneys and bladder are within normal limits. Stomach/Bowel: Stomach is within normal limits. Appendix appears normal. No evidence of bowel wall thickening, distention, or inflammatory changes. There is sigmoid colon diverticulosis. Vascular/Lymphatic:  Aortic atherosclerosis. No enlarged abdominal or pelvic lymph nodes. Reproductive: Prostate is unremarkable. Other: There is a small amount of free fluid in the pelvis. There is no focal abdominal wall hernia. Musculoskeletal: No acute or significant osseous findings. IMPRESSION: 1. Trace bilateral pleural effusions with adjacent atelectasis. 2. Mild wall thickening of the distal esophagus, nonspecific. 3. Mild heterogeneity of the liver, nonspecific. 4. Mild splenomegaly. 5. Small amount of free fluid in the pelvis. 6. Sigmoid colon diverticulosis. 7. Aortic atherosclerosis. Aortic Atherosclerosis (ICD10-I70.0). Electronically Signed   By: Tyron Gallon M.D.   On: 11/20/2023 22:35   Assessment/Plan Randy Quinn is a 48 y.o. male with medical history significant for melanotic nevi of the trunk, hyperlipidemia, recent treatment for cellulitis March 2025 being admitted to the hospital with newly recognized neutropenia with fever.   Sepsis-meeting criteria with fever, tachypnea, leukopenia.  Infectious source is suspected, but source is unclear at this time. -Inpatient admission -Follow-up blood cultures -Continue empiric IV vancomycin, IV Zosyn, IV doxycycline -Follow-up acute hepatitis panel, mononucleosis screen, RMSF antibodies, Ehrlichia antibody panel  Neutropenia with thrombocytopenia-unclear etiology, could be primary hematologic/oncologic process, versus effect of acute infection.  Potentially also related to recent Augmentin , though the antibiotic was discontinued several weeks ago after completing  a 2-week course. -Neutropenic precautions -Hematology evaluation is much appreciated  Abnormal LFTs-workup as above, note CT abdomen with mild nonspecific heterogeneity of the liver.  Will add on acetaminophen  level, trend LFTs with daily labs  Hyperlipidemia-hold statin due to abnormal LFTs  DVT prophylaxis: SCDs only due to thrombocytopenia  Full code  Consults called: ER provider  discussed with oncology Dr. Liam Redhead, who will consult.  ER provider also discussed this morning with Dr. Alwin Baars of ID.  Admission status: The appropriate patient status for this patient is INPATIENT. Inpatient status is judged to be reasonable and necessary in order to provide the required intensity of service to ensure the patient's safety. The patient's presenting symptoms, physical exam findings, and initial radiographic and laboratory data in the context of their chronic comorbidities is felt to place them at high risk for further clinical deterioration. Furthermore, it is not anticipated that the patient will be medically stable for discharge from the hospital within 2 midnights of admission.    I certify that at the point of admission it is my clinical judgment that the patient will require inpatient hospital care spanning beyond 2 midnights from the point of admission due to high intensity of service, high risk for further deterioration and high frequency of surveillance required  Time spent: 59 minutes  Glenisha Gundry Rickey Charm MD [facility] Pager [phone]  If 7PM-7AM, please contact night-coverage [email or web address] Password [facility]  11/21/2023, 11:46 AM

## 2023-11-21 NOTE — Progress Notes (Signed)
 Pharmacy Antibiotic Note  Randy Quinn is a 48 y.o. male admitted on 11/20/2023 with  febrile neutropenia .  Pharmacy has been consulted for vancomycin dosing. Tmax is 102.2 and WBC is low at 1. Received initial antibiotics doses overnight.   Plan: Vancomycin 750mg  IV Q8H  F/u renal fxn, C&S, clinical status and peak/trough at SS  Height: 5\' 10"  (177.8 cm) Weight: 86.2 kg (190 lb) IBW/kg (Calculated) : 73  Temp (24hrs), Avg:99.3 F (37.4 C), Min:98 F (36.7 C), Max:102.2 F (39 C)  Recent Labs  Lab 11/20/23 2050 11/20/23 2333  WBC 1.0*  --   CREATININE 1.14  --   LATICACIDVEN  --  0.9    Estimated Creatinine Clearance: 82.7 mL/min (by C-G formula based on SCr of 1.14 mg/dL).    No Known Allergies  Antimicrobials this admission: Vanc 5/6>> Zosyn 5/6>>  Dose adjustments this admission: N/A  Microbiology results: Pending  Thank you for allowing pharmacy to be a part of this patient's care.  Stella Bortle, Kathlene Paradise 11/21/2023 8:11 AM

## 2023-11-21 NOTE — ED Provider Notes (Addendum)
 Patient holding in the ED waiting for admission to the hospital for his neutropenic fever.    Family wondered about recent tick exposure.  Asked about mono  as well.   No thrombocytopenia noted but will add on mono, hepatitis panel, RMSF.  Reviewed case with Dr Alwin Baars as family contacted him directly.  Will add on ehrlichiosis labs.  Add doxy to his regimen.  Case discussed with Dr Ali Ink.  Suspects more infectious etiology.  Can see in consultation when patient is in the hospital  Remains normotensive.  Continue broad spectrum abx.  Will review case with heme onc while pt is pending.  .Critical Care  Performed by: Trish Furl, MD Authorized by: Trish Furl, MD   Critical care provider statement:    Critical care time (minutes):  30   Critical care was time spent personally by me on the following activities:  Development of treatment plan with patient or surrogate, discussions with consultants, evaluation of patient's response to treatment, examination of patient, ordering and review of laboratory studies, ordering and performing treatments and interventions, re-evaluation of patient's condition, review of old charts and pulse oximetry        Trish Furl, MD 11/21/23 [phone]

## 2023-11-21 NOTE — Plan of Care (Signed)

## 2023-11-21 NOTE — Consult Note (Addendum)
 [facility]  [phone] [phone]    HEMATOLOGY CONSULTATION  PURPOSE OF CONSULTATION/CHIEF COMPLAINT: Neutropenic Fever  Referring MD: Dr. Del Favia  HPI: Randy Quinn is a 48 year old male patient who went to [facility] on 11/20/2023 ED complaining of fever x 4 days.  Reported that he had been taking Tylenol  and Motrin but fever would return when medication wore off.   Workup was done in the ED and patient found to have low platelets and very low WBC with low ANC.  Therefore hematology consult has been requested. Patient seen today awake alert and oriented x 3.  Family at bedside.  Patient reports complaints as listed above stating fever with chills since last week Friday.  Denies cough, congestion, chest pain, bleeding or other acute symptoms. Medical history includes hyperlipidemia.  Also significant for dysplastic moles which he has had removed for the last 15 to 20 years at a rate of 1 mole every year or so, most recent mole removed from his lower back.  Also relates recent trauma to right inner thigh when a fence post went through his leg. Surgical history is noncontributory, includes knee surgery. Family history is noncontributory. Social history-admits to social alcohol use a few times a week.  Denies tobacco use.  Denies illicit drug use.  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  Of note, states he removed a tick from his body 2 weeks ago and another tick last week.   ASSESSMENT AND PLAN:  Febrile neutropenia - WBC 1.0 with ANC 0.6 on 11/20/2023 - Patient reports ongoing fevers x 4 days, indeed temp taken in the ED 102.2.  Most recent temp 98.2. - Patient with significant history for being frequently outdoors in the woods and admits to removing 2 ticks from his body within the last 2 weeks. - Continue IV antibiotics as ordered - Continue IV fluids as ordered -  Continue to monitor fever curve - Viral labs are pending, will follow results - Hematology/Dr. Marguerita Shih following and will make further evaluation and treatment recommendations.  Thrombocytopenia - Patient with low platelets 57K on 11/20/2023 - May be secondary to viral etiology - Transfuse platelets for counts <20 K or <50 K with bleeding - No transfusional intervention required at this time  Transaminitis - Elevated LFTs noted - Avoid hepatotoxic agents - Monitor CMP    Past Medical History:  Diagnosis Date   Dysplastic nevi    Hyperlipidemia   :  Past Surgical History:  Procedure Laterality Date   KNEE SURGERY    :  No Known Allergies:   Family History  Problem Relation Age of Onset   Colon cancer Neg Hx    Colon polyps Neg Hx    Esophageal cancer Neg Hx    Stomach cancer Neg Hx    Rectal cancer Neg Hx   :   Social History   Socioeconomic History   Marital status: Married    Spouse name: Not on file   Number of children: Not on file   Years of education: Not on file   Highest education level: Not on file  Occupational History   Not on file  Tobacco Use   Smoking status: Never   Smokeless tobacco: Never   Tobacco comments:    Dip in HS for very short period   Substance and Sexual Activity   Alcohol use: Yes  Comment: several times a week   Drug use: Never   Sexual activity: Not on file  Other Topics Concern   Not on file  Social History Narrative   Not on file   Social Drivers of Health   Financial Resource Strain: Not on file  Food Insecurity: Not on file  Transportation Needs: Not on file  Physical Activity: Not on file  Stress: Not on file  Social Connections: Not on file  Intimate Partner Violence: Not on file  :   CURRENT MEDS: Current Facility-Administered Medications  Medication Dose Route Frequency Provider Last Rate Last Admin   albuterol (PROVENTIL) (2.5 MG/3ML) 0.083% nebulizer solution 2.5 mg  2.5 mg Nebulization Q2H PRN  Jannette Mend, Mir M, MD       doxycycline (VIBRA-TABS) tablet 100 mg  100 mg Oral Q12H Jannette Mend, Mir M, MD       ibuprofen (ADVIL) tablet 400 mg  400 mg Oral Q6H PRN Jannette Mend, Mir M, MD       lactated ringers infusion   Intravenous Continuous Mesner, Reymundo Caulk, MD 125 mL/hr at 11/21/23 0508 New Bag at 11/21/23 0508   ondansetron (ZOFRAN) tablet 4 mg  4 mg Oral Q6H PRN Jannette Mend, Mir M, MD       Or   ondansetron (ZOFRAN) injection 4 mg  4 mg Intravenous Q6H PRN Jannette Mend, Mir M, MD       piperacillin-tazobactam (ZOSYN) IVPB 3.375 g  3.375 g Intravenous Q8H Knapp, Jon, MD 12.5 mL/hr at 11/21/23 0811 3.375 g at 11/21/23 0811   traZODone (DESYREL) tablet 50 mg  50 mg Oral QHS PRN Jannette Mend, Mir M, MD       vancomycin (VANCOREADY) IVPB 750 mg/150 mL  750 mg Intravenous Q8H Hall, Carole N, DO        PHYSICAL EXAMINATION: ECOG PERFORMANCE STATUS: 1 - Symptomatic but completely ambulatory  Vitals:   11/21/23 0900 11/21/23 1043  BP: 127/73 121/70  Pulse: 76 81  Resp: 19 18  Temp:  98.9 F (37.2 C)  SpO2: 97% 98%   Filed Weights   11/20/23 2044  Weight: 190 lb (86.2 kg)    GENERAL: alert, no distress and comfortable SKIN: skin color, texture, turgor are normal, no rashes or significant lesions EYES: normal, conjunctiva are pink and non-injected, sclera clear OROPHARYNX: no exudate, no erythema and lips, buccal mucosa, and tongue normal  NECK: supple, thyroid normal size, non-tender, without nodularity LYMPH: no palpable lymphadenopathy in the cervical, axillary or inguinal LUNGS: clear to auscultation and percussion with normal breathing effort HEART: regular rate & rhythm and no murmurs and no lower extremity edema ABDOMEN: abdomen soft, non-tender and normal bowel sounds MUSCULOSKELETAL: no cyanosis of digits and no clubbing  PSYCH: alert & oriented x 3 with fluent speech NEURO: no focal motor/sensory deficits   LABS: Lab Results  Component Value Date   WBC 1.0 (LL) 11/20/2023    HGB 14.7 11/20/2023   HCT 40.9 11/20/2023   MCV 88.7 11/20/2023   PLT 57 (L) 11/20/2023    Lab Results  Component Value Date   WBC 1.0 (LL) 11/20/2023   HGB 14.7 11/20/2023   HCT 40.9 11/20/2023   PLT 57 (L) 11/20/2023   GLUCOSE 148 (H) 11/20/2023   ALT 389 (H) 11/20/2023   AST 484 (H) 11/20/2023   NA 139 11/20/2023   K 3.9 11/20/2023   CL 103 11/20/2023   CREATININE 1.14 11/20/2023   BUN 12 11/20/2023   CO2 27 11/20/2023    CT CHEST  ABDOMEN PELVIS W CONTRAST Result Date: 11/20/2023 CLINICAL DATA:  Febrile neutropenia.  Elevated LFTs. EXAM: CT CHEST, ABDOMEN, AND PELVIS WITH CONTRAST TECHNIQUE: Multidetector CT imaging of the chest, abdomen and pelvis was performed following the standard protocol during bolus administration of intravenous contrast. RADIATION DOSE REDUCTION: This exam was performed according to the departmental dose-optimization program which includes automated exposure control, adjustment of the mA and/or kV according to patient size and/or use of iterative reconstruction technique. CONTRAST:  100mL OMNIPAQUE IOHEXOL 300 MG/ML  SOLN COMPARISON:  None Available. FINDINGS: CT CHEST FINDINGS Cardiovascular: No significant vascular findings. Normal heart size. No pericardial effusion. Mediastinum/Nodes: There is some wall thickening of the distal esophagus. Esophagus is nondilated. There are no enlarged mediastinal or hilar lymph nodes. Visualized thyroid gland is within normal limits. Lungs/Pleura: There is a small amount of atelectasis in both lung bases. The lungs are otherwise clear. There are trace bilateral pleural effusions. Musculoskeletal: No chest wall mass or suspicious bone lesions identified. CT ABDOMEN PELVIS FINDINGS Hepatobiliary: There is a subcentimeter hypodensity in the left lobe of the liver image 2/60 which is too small to characterize. The liver is mildly heterogeneous, but normal in size. The gallbladder and bile ducts are within normal limits. Pancreas:  Unremarkable. No pancreatic ductal dilatation or surrounding inflammatory changes. Spleen: Mildly enlarged. Adrenals/Urinary Tract: There is a subcentimeter hypodensity in the right kidney which is too small to characterize, likely a cyst. Otherwise, the adrenal glands, kidneys and bladder are within normal limits. Stomach/Bowel: Stomach is within normal limits. Appendix appears normal. No evidence of bowel wall thickening, distention, or inflammatory changes. There is sigmoid colon diverticulosis. Vascular/Lymphatic: Aortic atherosclerosis. No enlarged abdominal or pelvic lymph nodes. Reproductive: Prostate is unremarkable. Other: There is a small amount of free fluid in the pelvis. There is no focal abdominal wall hernia. Musculoskeletal: No acute or significant osseous findings. IMPRESSION: 1. Trace bilateral pleural effusions with adjacent atelectasis. 2. Mild wall thickening of the distal esophagus, nonspecific. 3. Mild heterogeneity of the liver, nonspecific. 4. Mild splenomegaly. 5. Small amount of free fluid in the pelvis. 6. Sigmoid colon diverticulosis. 7. Aortic atherosclerosis. Aortic Atherosclerosis (ICD10-I70.0). Electronically Signed   By: Tyron Gallon M.D.   On: 11/20/2023 22:35     The total time spent in the appointment was 55 minutes encounter with patients including review of chart and various tests results, discussions about plan of care and coordination of care plan   All questions were answered. The patient knows to call the clinic with any problems, questions or concerns. No barriers to learning was detected.  Thank you for the courtesy of this consultation, Jacqualin Mate, NP  5/7/202511:58 AM  ADDENDUM: Hematology/Oncology Attending: The patient is seen and examined today.  I reviewed his record, lab and recommended his care plan.  I agree with the above note. The patient is a 48 year old who presents with low white blood count, low platelets, and elevated liver enzymes. He  is accompanied by his wife, Christiane Cowing. He was referred by his general practitioner for further evaluation of low white blood count and low platelets.  He has been experiencing fever, chills, and night sweats since Friday night, with temperatures reaching 102 to 103 degrees Fahrenheit. These symptoms persisted over the weekend and into Monday.  On Tuesday afternoon, he visited his general practitioner, who conducted blood work. The results showed low white blood cell count and low platelets, prompting a referral for further evaluation.  He has a history of recent  tick bites, with one occurring a week ago and another the week before while [place] hunting. 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  He has been alternating between ibuprofen and Tylenol  to manage his fever. He takes 600 to 800 mg of ibuprofen every four to six hours initially, then alternates with Tylenol , taking two 500 mg tablets every other time.  He has been on Crestor , 40 mg daily, for about a month. His CBC in March was normal, but recent labs show low white blood count and platelets, with elevated liver enzymes.  No rash or swollen glands. He has experienced fever, chills, and night sweats.  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 Recent normal CBC in March. Possible causes include recent ibuprofen and Crestor  use. Differential includes viral infection or Lyme disease. Concern for leukemia, but drug-induced etiology is suspected. Ibuprofen is known to suppress bone marrow and cause significant drops in blood counts. Crestor  can also affect blood counts. - Hold ibuprofen - Monitor blood counts closely - Review blood smear for abnormal cells  Fever and chills Fever up to 102-103F with chills, starting several days ago. Current temperature is 98.51F. Alternated ibuprofen and acetaminophen  for fever  management. Possible viral infection or Lyme disease considered. Doxycycline administered in the emergency room. - Monitor temperature - Administer antibiotics as needed  Elevated liver enzymes Elevated liver enzymes likely due to recent use of ibuprofen and acetaminophen . Both medications can elevate liver enzymes. - Hold ibuprofen and acetaminophen  Thank you for allowing me to participate in the care of Mr. Kruppa.  I will continue to follow the patient with you and assist in his management on as-needed basis. Disclaimer: This note was dictated with voice recognition software. Similar sounding words can inadvertently be transcribed and may be missed upon review. Aurelio Blower, MD

## 2023-11-21 NOTE — Progress Notes (Signed)
 eLink Physician-Brief Progress Note Patient Name: ARL VANDORN DOB: 1975-08-31 MRN: [id number]   Date of Service  11/21/2023  HPI/Events of Note  Labs, including DIC panel reviewed.  eICU Interventions  No immediate intervention.        Shari Daughters Marti Acebo 11/21/2023, 10:34 PM

## 2023-11-21 NOTE — ED Notes (Signed)
 Patient's sheets and gown changed after fever broke and became wet. Patient stating he "feels better"

## 2023-11-21 NOTE — Progress Notes (Addendum)
  I am ICU attending PAtient is on triad MD ervic in SDU Wife is known to us  from her occupation as OT at [employer] She saw me in hallway and asked for chart rview Triad MD ok with that  Patient looks stable on visual walk exam Chart rviewd  Ordered stat labs  = VBG, Tylenol  level, Sal level, UDS, PCT, lacitate, CBC, BMET, Mag, Phos, LFT , Procal  STAT  Also DIC panel and Smear review  Updated wife -> Lori over phone   SIGNATURE    Dr. Maire Scot, M.D., F.C.C.P,  Pulmonary and Critical Care Medicine Staff Physician, [facility] Director - Interstitial Lung Disease  Program  Pulmonary Fibrosis [facility] [place], [place], [address]   Pager: [phone], If no answer  -> Check AMION or Try [phone] Telephone (clinical office): [phone] Telephone (research): [phone]  6:34 PM 11/21/2023

## 2023-11-22 ENCOUNTER — Observation Stay (HOSPITAL_COMMUNITY)

## 2023-11-22 DIAGNOSIS — D61818 Other pancytopenia: Secondary | ICD-10-CM

## 2023-11-22 DIAGNOSIS — R509 Fever, unspecified: Secondary | ICD-10-CM | POA: Diagnosis not present

## 2023-11-22 DIAGNOSIS — D696 Thrombocytopenia, unspecified: Secondary | ICD-10-CM | POA: Diagnosis not present

## 2023-11-22 DIAGNOSIS — R7989 Other specified abnormal findings of blood chemistry: Secondary | ICD-10-CM

## 2023-11-22 DIAGNOSIS — D709 Neutropenia, unspecified: Secondary | ICD-10-CM | POA: Diagnosis not present

## 2023-11-22 DIAGNOSIS — R5081 Fever presenting with conditions classified elsewhere: Secondary | ICD-10-CM | POA: Diagnosis not present

## 2023-11-22 DIAGNOSIS — A938 Other specified arthropod-borne viral fevers: Secondary | ICD-10-CM

## 2023-11-22 DIAGNOSIS — R7401 Elevation of levels of liver transaminase levels: Secondary | ICD-10-CM

## 2023-11-22 LAB — DIFFERENTIAL
Abs Immature Granulocytes: 0 10*3/uL (ref 0.00–0.07)
Basophils Absolute: 0.1 10*3/uL (ref 0.0–0.1)
Basophils Relative: 2 %
Eosinophils Absolute: 0 10*3/uL (ref 0.0–0.5)
Eosinophils Relative: 0 %
Immature Granulocytes: 0 %
Lymphocytes Relative: 50 %
Lymphs Abs: 1.5 10*3/uL (ref 0.7–4.0)
Monocytes Absolute: 0.2 10*3/uL (ref 0.1–1.0)
Monocytes Relative: 8 %
Neutro Abs: 1.2 10*3/uL — ABNORMAL LOW (ref 1.7–7.7)
Neutrophils Relative %: 40 %

## 2023-11-22 LAB — COMPREHENSIVE METABOLIC PANEL WITH GFR
ALT: 340 U/L — ABNORMAL HIGH (ref 0–44)
AST: 273 U/L — ABNORMAL HIGH (ref 15–41)
Albumin: 3.1 g/dL — ABNORMAL LOW (ref 3.5–5.0)
Alkaline Phosphatase: 120 U/L (ref 38–126)
Anion gap: 10 (ref 5–15)
BUN: 8 mg/dL (ref 6–20)
CO2: 23 mmol/L (ref 22–32)
Calcium: 8.1 mg/dL — ABNORMAL LOW (ref 8.9–10.3)
Chloride: 104 mmol/L (ref 98–111)
Creatinine, Ser: 0.93 mg/dL (ref 0.61–1.24)
GFR, Estimated: >60 mL/min (ref >60)
Glucose, Bld: 117 mg/dL — ABNORMAL HIGH (ref 70–99)
Potassium: 4 mmol/L (ref 3.5–5.1)
Sodium: 137 mmol/L (ref 135–145)
Total Bilirubin: 1.1 mg/dL (ref 0.0–1.2)
Total Protein: 5.9 g/dL — ABNORMAL LOW (ref 6.5–8.1)

## 2023-11-22 LAB — CBC
HCT: 41.1 % (ref 39.0–52.0)
Hemoglobin: 13.8 g/dL (ref 13.0–17.0)
MCH: 31.8 pg (ref 26.0–34.0)
MCHC: 33.6 g/dL (ref 30.0–36.0)
MCV: 94.7 fL (ref 80.0–100.0)
Platelets: 53 10*3/uL — ABNORMAL LOW (ref 150–400)
RBC: 4.34 MIL/uL (ref 4.22–5.81)
RDW: 11.5 % (ref 11.5–15.5)
WBC: 3 10*3/uL — ABNORMAL LOW (ref 4.0–10.5)
nRBC: 0 % (ref 0.0–0.2)

## 2023-11-22 LAB — TECHNOLOGIST SMEAR REVIEW

## 2023-11-22 NOTE — Progress Notes (Signed)
 BLE venous duplex has been completed.    Results can be found under chart review under CV PROC. 11/22/2023 5:53 PM Korby Ratay RVT, RDMS

## 2023-11-22 NOTE — Progress Notes (Signed)
   11/22/23 1029  TOC Brief Assessment  Insurance and Status Reviewed  Patient has primary care physician Yes  Home environment has been reviewed home w/ family  Prior level of function: independent  Prior/Current Home Services No current home services  Social Drivers of Health Review SDOH reviewed no interventions necessary  Readmission risk has been reviewed Yes  Transition of care needs no transition of care needs at this time

## 2023-11-22 NOTE — Progress Notes (Signed)
 [facility]  PROGRESS NOTE  Randy Quinn YQM:[id number] DOB: [birth year] DOA: 11/20/2023 PCP: Benedetto Brady, MD   Brief HPI:     48 y.o. male with medical history significant for melanotic nevi of the trunk, hyperlipidemia, recent treatment with Augmentin  for laceration and cellulitis March 2025 being admitted to the hospital with newly recognized neutropenia and thrombocytopenia with fever.  Patient states he has been having ongoing fever at home for the last 4 to 5 days, with Tmax of 103.    Assessment/Plan:   Tickborne illness - Likely ehrlichiosis versus RMSF - Clinical picture fits tickborne illness with leukopenia, thrombocytopenia, fever, transaminitis; patient had tick bite few days ago - Continue doxycycline - Will consult ID for further recommendations  ?  Sepsis - Patient empirically started on vancomycin, Zosyn - Follow blood culture results    Neutropenia with thrombocytopenia -Likely in setting of tickborne illness as above -WBC is improving -Hematology evaluation is much appreciated   Transaminitis -Likely in setting of tickborne illness as above -Follow LFTs in a.m.   Hyperlipidemia -hold statin due to abnormal LFTs  Elevated D-dimer - D-dimer elevated at 9.02 - Denies shortness of breath - Likely in setting of tickborne illness - Will check venous duplex of lower extremities rule out DVT  Medications     Chlorhexidine Gluconate Cloth  6 each Topical Daily   doxycycline  100 mg Oral Q12H     Data Reviewed:   CBG:  No results for input(s): "GLUCAP" in the last 168 hours.  SpO2: 91 %    Vitals:   11/22/23 0200 11/22/23 0300 11/22/23 0317 11/22/23 0400  BP: 124/83 128/65    Pulse: 74 77  93  Resp: 18 17  (!) 22  Temp:   99.4 F (37.4 C)   TempSrc:   Oral   SpO2: 93% 95%  91%  Weight:      Height:          Data Reviewed:  Basic Metabolic Panel: Recent Labs  Lab 11/20/23 2050 11/21/23 1900 11/22/23 0327  NA 139 137 137   K 3.9 3.7 4.0  CL 103 104 104  CO2 27 24 23   GLUCOSE 148* 139* 117*  BUN 12 10 8   CREATININE 1.14 0.78 0.93  CALCIUM  8.9 8.0* 8.1*  MG  --  1.8  --   PHOS  --  2.3*  --     CBC: Recent Labs  Lab 11/20/23 2050 11/21/23 1732 11/21/23 1733 11/21/23 1900 11/22/23 0327  WBC 1.0* 1.9*  --  1.5* 3.0*  NEUTROABS 0.6* 0.9*  --  0.7*  --   HGB 14.7 14.4  --  13.5 13.8  HCT 40.9 41.4  --  37.5* 41.1  MCV 88.7 91.2  --  89.5 94.7  PLT 57* 58* 57* 51*  50* 53*    LFT Recent Labs  Lab 11/20/23 2050 11/21/23 1900 11/22/23 0327  AST 484* 330* 273*  ALT 389* 353* 340*  ALKPHOS 127* 121 120  BILITOT 0.8 1.0 1.1  PROT 6.0* 5.6* 5.9*  ALBUMIN 3.8 3.0* 3.1*     Antibiotics: Anti-infectives (From admission, onward)    Start     Dose/Rate Route Frequency Ordered Stop   11/21/23 1700  vancomycin (VANCOREADY) IVPB 750 mg/150 mL        750 mg 150 mL/hr over 60 Minutes Intravenous Every 8 hours 11/21/23 0808     11/21/23 1145  doxycycline (VIBRA-TABS) tablet 100 mg  100 mg Oral Every 12 hours 11/21/23 1144     11/21/23 0900  doxycycline (VIBRA-TABS) tablet 100 mg        100 mg Oral  Once 11/21/23 0856 11/21/23 0943   11/21/23 0830  piperacillin-tazobactam (ZOSYN) IVPB 3.375 g        3.375 g 12.5 mL/hr over 240 Minutes Intravenous Every 8 hours 11/21/23 0803     11/21/23 0830  vancomycin (VANCOCIN) 750 mg in sodium chloride  0.9 % 250 mL IVPB        750 mg 250 mL/hr over 60 Minutes Intravenous Every 8 hours 11/21/23 0808 11/21/23 1041   11/20/23 2330  piperacillin-tazobactam (ZOSYN) IVPB 3.375 g        3.375 g 100 mL/hr over 30 Minutes Intravenous  Once 11/20/23 2328 11/21/23 0013   11/20/23 2330  vancomycin (VANCOCIN) IVPB 1000 mg/200 mL premix       Placed in "Followed by" Linked Group   1,000 mg 200 mL/hr over 60 Minutes Intravenous  Once 11/20/23 2328 11/21/23 0221   11/20/23 2330  vancomycin (VANCOCIN) IVPB 1000 mg/200 mL premix       Placed in "Followed by" Linked  Group   1,000 mg 200 mL/hr over 60 Minutes Intravenous  Once 11/20/23 2328 11/21/23 0112        DVT prophylaxis: SCDs  Code Status: Full code  Family Communication:    CONSULTS hematology/oncology   Subjective   Denies abdominal pain   Objective    Physical Examination:   General-appears in no acute distress Heart-S1-S2, regular, no murmur auscultated Lungs-clear to auscultation bilaterally, no wheezing or crackles auscultated Abdomen-soft, nontender, no organomegaly Extremities-no edema in the lower extremities Neuro-alert, oriented x3, no focal deficit noted  Status is: Inpatient:             Randy Quinn   [facility] If 7PM-7AM, please contact night-coverage at [email or web address], Office  [phone]   11/22/2023, 7:36 AM  LOS: 1 day

## 2023-11-22 NOTE — Plan of Care (Signed)
  Problem: Clinical Measurements: Goal: Respiratory complications will improve Outcome: Progressing Goal: Cardiovascular complication will be avoided Outcome: Progressing   Problem: Activity: Goal: Risk for activity intolerance will decrease Outcome: Progressing   Problem: Nutrition: Goal: Adequate nutrition will be maintained Outcome: Progressing   Problem: Coping: Goal: Level of anxiety will decrease Outcome: Progressing   Problem: Elimination: Goal: Will not experience complications related to urinary retention Outcome: Progressing   Problem: Safety: Goal: Ability to remain free from injury will improve Outcome: Progressing

## 2023-11-22 NOTE — Plan of Care (Signed)
  Problem: Education: Goal: Knowledge of General Education information will improve Description: Including pain rating scale, medication(s)/side effects and non-pharmacologic comfort measures Outcome: Progressing   Problem: Clinical Measurements: Goal: Diagnostic test results will improve Outcome: Progressing   Problem: Activity: Goal: Risk for activity intolerance will decrease Outcome: Progressing   Problem: Coping: Goal: Level of anxiety will decrease Outcome: Progressing   Problem: Pain Managment: Goal: General experience of comfort will improve and/or be controlled Outcome: Progressing   Problem: Safety: Goal: Ability to remain free from injury will improve Outcome: Progressing   Problem: Skin Integrity: Goal: Risk for impaired skin integrity will decrease Outcome: Progressing

## 2023-11-22 NOTE — Consult Note (Signed)
 [facility]    Date of Admission:  11/20/2023     Reason for Consult: tick born infection    Referring Provider: Daivd Dub     Lines:  Peripheral iv's  Abx: 5/7-c doxy  5/7-8 piptazo/vanc        Assessment: 48 yo male, married, admitted 5/6 for 4-5 days febrile illness, malaise, found to have pancytopenia, transaminitis, in setting of tick bite the last 2 weeks  Ddx viral process such as parvo, hiv, ebv, toxo, cmv but epidemiology unlikely. Rats bites fever no risk. Bcx negative so far r/o other gram negative severe sepsis. Not a risk for capnocytophagia infection (he does have dog)  Quick turn around with doxycycline along with suggestive labs findings in this area likely rmsf or ehrlichiosis. He did went to [place] 2 weeks ago but stayed indoor - unlikely anaplasmosis/babesiosis  Resp viral pcr negative; hiv ab negative; hepatitis serology negative; negative mononucleosis screen  Wbc improving Lft improving x2 days  Plan: Stop vanc/piptazo F/u tick serology (rmsf, ehrlichiosis) F/u peripheral smear review to see if any WBC intranuclear inclusion bodies Continue doxy. I typically treat for another 5 days beyond defervescence or after 3 days clinical improvement/stability (the current trajectory more days seems appropriate) Id clinic f/u arranged 5/29 @ 9am to repeat tick serology testing Standard isolation precaution is fine If tomorrow continues to improve can d/c home from our standpoint Discussed with primary team     ------------------------------------------------ Principal Problem:   Neutropenic fever (HCC)    HPI: Randy Quinn is a 48 y.o. male married, admitted 5/6 for 4-5 days febrile illness, malaise, found to have pancytopenia, transaminitis, in setting of tick bite the last 2 weeks  Patient has had tick bites within the past 2 weeks cleaning his yard.   4 days prior to admission started having chills, then  progressed over the next 3 days to severe drenching sweat, ongoing fever/chill, malaise  Saw his pcp on Tuesday for basic labs and told to come for admission given low cbc profile (platelet/wbc)  On admission febrile to 102s Wbc 1; platelet 57; normal hb Cr 1.1; lft 484/389 Hiv, hepatitis, ebv screen negative Pending ehrlichiosis, rmsf serology Bcx negative  Vanc/piptazo Doxy added hd#1  Today HD#2 feelign better; wbc improving, lft improving   2 dogs in/out door Teens at home not sick Wife not sick In [place] 2 weeks prior Drove to [place] just prior to onset of sx  Tick bites within 2 weeks here in [place]  Didn't clean any attic dwelling; no pets rats Married/monogamous    Some nausea with abx, some mild headache  No joint pain/swelling, rash, cough, chest pain, diarrhea   Family History  Problem Relation Age of Onset   Colon cancer Neg Hx    Colon polyps Neg Hx    Esophageal cancer Neg Hx    Stomach cancer Neg Hx    Rectal cancer Neg Hx     Social History   Tobacco Use   Smoking status: Never   Smokeless tobacco: Never   Tobacco comments:    Dip in HS for very short period   Substance Use Topics   Alcohol use: Yes    Comment: several times a week   Drug use: Never    No Known Allergies  Review of Systems: ROS All Other ROS was negative, except mentioned above   Past Medical History:  Diagnosis Date   Dysplastic nevi  Hyperlipidemia        Scheduled Meds:  Chlorhexidine Gluconate Cloth  6 each Topical Daily   doxycycline  100 mg Oral Q12H   Continuous Infusions: PRN Meds:.albuterol, ibuprofen, ondansetron **OR** ondansetron (ZOFRAN) IV, traZODone   OBJECTIVE: Blood pressure 138/83, pulse 78, temperature 98.2 F (36.8 C), temperature source Oral, resp. rate (!) 22, height 5\' 10"  (1.778 m), weight 90.3 kg, SpO2 93%.  Physical Exam  General/constitutional: no distress, pleasant HEENT: Normocephalic, PER, Conj Clear,  EOMI, Oropharynx clear Neck supple CV: rrr no mrg Lungs: clear to auscultation, normal respiratory effort Abd: Soft, Nontender Ext: no edema Skin: No Rash Neuro: nonfocal MSK: no peripheral joint swelling/tenderness/warmth; back spines nontender     Lab Results Lab Results  Component Value Date   WBC 3.0 (L) 11/22/2023   HGB 13.8 11/22/2023   HCT 41.1 11/22/2023   MCV 94.7 11/22/2023   PLT 53 (L) 11/22/2023    Lab Results  Component Value Date   CREATININE 0.93 11/22/2023   BUN 8 11/22/2023   NA 137 11/22/2023   K 4.0 11/22/2023   CL 104 11/22/2023   CO2 23 11/22/2023    Lab Results  Component Value Date   ALT 340 (H) 11/22/2023   AST 273 (H) 11/22/2023   ALKPHOS 120 11/22/2023   BILITOT 1.1 11/22/2023      Microbiology: Recent Results (from the past 240 hours)  Culture, blood (Routine X 2) w Reflex to ID Panel     Status: None (Preliminary result)   Collection Time: 11/20/23 10:00 PM   Specimen: Right Antecubital; Blood  Result Value Ref Range Status   Specimen Description   Final    RIGHT ANTECUBITAL Performed at [facility], [address] [place], [place] [address]    Special Requests   Final    BOTTLES DRAWN AEROBIC AND ANAEROBIC Blood Culture adequate volume Performed at [facility], [address] [place], [place] [address]    Culture   Final    NO GROWTH 1 DAY Performed at [facility], [address], [place], [place] [address]    Report Status PENDING  Incomplete  Culture, blood (Routine X 2) w Reflex to ID Panel     Status: None (Preliminary result)   Collection Time: 11/20/23 10:05 PM   Specimen: BLOOD LEFT HAND  Result Value Ref Range Status   Specimen Description   Final    BLOOD LEFT HAND Performed at [facility], [address], [place], [place] [address]    Special Requests   Final    BOTTLES DRAWN AEROBIC AND ANAEROBIC Blood Culture adequate volume Performed at  [facility], [address] [place], [place] [address]    Culture   Final    NO GROWTH 1 DAY Performed at [facility], [address], [place], [place] [address]    Report Status PENDING  Incomplete  MRSA Next Gen by PCR, Nasal     Status: None   Collection Time: 11/21/23 12:03 PM   Specimen: Nasal Mucosa; Nasal Swab  Result Value Ref Range Status   MRSA by PCR Next Gen NOT DETECTED NOT DETECTED Final    Comment: (NOTE) The GeneXpert MRSA Assay (FDA approved for NASAL specimens only), is one component of a comprehensive MRSA colonization surveillance program. It is not intended to diagnose MRSA infection nor to guide or monitor treatment for MRSA infections. Test performance is not FDA approved in patients less than 2 years  old. Performed at [facility], [address], [place], [place] [address]   Resp panel by RT-PCR (RSV, Flu A&B, Covid) Anterior Nasal Swab     Status: None   Collection Time: 11/21/23  1:38 PM   Specimen: Anterior Nasal Swab  Result Value Ref Range Status   SARS Coronavirus 2 by RT PCR NEGATIVE NEGATIVE Final    Comment: (NOTE) SARS-CoV-2 target nucleic acids are NOT DETECTED.  The SARS-CoV-2 RNA is generally detectable in upper respiratory specimens during the acute phase of infection. The lowest concentration of SARS-CoV-2 viral copies this assay can detect is 138 copies/mL. A negative result does not preclude SARS-Cov-2 infection and should not be used as the sole basis for treatment or other patient management decisions. A negative result may occur with  improper specimen collection/handling, submission of specimen other than nasopharyngeal swab, presence of viral mutation(s) within the areas targeted by this assay, and inadequate number of viral copies(<138 copies/mL). A negative result must be combined with clinical observations, patient history, and epidemiological information. The expected result is  Negative.  Fact Sheet for Patients:  BloggerCourse.com  Fact Sheet for Healthcare Providers:  SeriousBroker.it  This test is no t yet approved or cleared by the [place]  FDA and  has been authorized for detection and/or diagnosis of SARS-CoV-2 by FDA under an Emergency Use Authorization (EUA). This EUA will remain  in effect (meaning this test can be used) for the duration of the COVID-19 declaration under Section 564(b)(1) of the Act, 21 U.S.C.section 360bbb-3(b)(1), unless the authorization is terminated  or revoked sooner.       Influenza A by PCR NEGATIVE NEGATIVE Final   Influenza B by PCR NEGATIVE NEGATIVE Final    Comment: (NOTE) The Xpert Xpress SARS-CoV-2/FLU/RSV plus assay is intended as an aid in the diagnosis of influenza from Nasopharyngeal swab specimens and should not be used as a sole basis for treatment. Nasal washings and aspirates are unacceptable for Xpert Xpress SARS-CoV-2/FLU/RSV testing.  Fact Sheet for Patients: BloggerCourse.com  Fact Sheet for Healthcare Providers: SeriousBroker.it  This test is not yet approved or cleared by the [place]  FDA and has been authorized for detection and/or diagnosis of SARS-CoV-2 by FDA under an Emergency Use Authorization (EUA). This EUA will remain in effect (meaning this test can be used) for the duration of the COVID-19 declaration under Section 564(b)(1) of the Act, 21 U.S.C. section 360bbb-3(b)(1), unless the authorization is terminated or revoked.     Resp Syncytial Virus by PCR NEGATIVE NEGATIVE Final    Comment: (NOTE) Fact Sheet for Patients: BloggerCourse.com  Fact Sheet for Healthcare Providers: SeriousBroker.it  This test is not yet approved or cleared by the [place]  FDA and has been authorized for detection and/or diagnosis of  SARS-CoV-2 by FDA under an Emergency Use Authorization (EUA). This EUA will remain in effect (meaning this test can be used) for the duration of the COVID-19 declaration under Section 564(b)(1) of the Act, 21 U.S.C. section 360bbb-3(b)(1), unless the authorization is terminated or revoked.  Performed at [facility], [address], [place], [place] [address]   Respiratory (~20 pathogens) panel by PCR     Status: None   Collection Time: 11/21/23  1:38 PM   Specimen: Anterior Nasal Swab; Respiratory  Result Value Ref Range Status   Adenovirus NOT DETECTED NOT DETECTED Final   Coronavirus 229E NOT DETECTED NOT DETECTED Final    Comment: (NOTE) The Coronavirus on the Respiratory Panel, DOES NOT  test for the novel  Coronavirus (2019 nCoV)    Coronavirus HKU1 NOT DETECTED NOT DETECTED Final   Coronavirus NL63 NOT DETECTED NOT DETECTED Final   Coronavirus OC43 NOT DETECTED NOT DETECTED Final   Metapneumovirus NOT DETECTED NOT DETECTED Final   Rhinovirus / Enterovirus NOT DETECTED NOT DETECTED Final   Influenza A NOT DETECTED NOT DETECTED Final   Influenza B NOT DETECTED NOT DETECTED Final   Parainfluenza Virus 1 NOT DETECTED NOT DETECTED Final   Parainfluenza Virus 2 NOT DETECTED NOT DETECTED Final   Parainfluenza Virus 3 NOT DETECTED NOT DETECTED Final   Parainfluenza Virus 4 NOT DETECTED NOT DETECTED Final   Respiratory Syncytial Virus NOT DETECTED NOT DETECTED Final   Bordetella pertussis NOT DETECTED NOT DETECTED Final   Bordetella Parapertussis NOT DETECTED NOT DETECTED Final   Chlamydophila pneumoniae NOT DETECTED NOT DETECTED Final   Mycoplasma pneumoniae NOT DETECTED NOT DETECTED Final    Comment: Performed at [facility], [address], [place], [place] [address]     Serology:    Imaging: If present, new imagings (plain films, ct scans, and mri) have been personally visualized and interpreted; radiology reports have been reviewed. Decision  making incorporated into the Impression / Recommendations.  5/6 chest abd pelv ct with contrast 1. Trace bilateral pleural effusions with adjacent atelectasis. 2. Mild wall thickening of the distal esophagus, nonspecific. 3. Mild heterogeneity of the liver, nonspecific. 4. Mild splenomegaly. 5. Small amount of free fluid in the pelvis. 6. Sigmoid colon diverticulosis. 7. Aortic atherosclerosis.   Jamesetta Mcbride, MD [facility] [phone] pager    11/22/2023, 3:12 PM

## 2023-11-23 ENCOUNTER — Other Ambulatory Visit (HOSPITAL_COMMUNITY): Payer: Self-pay

## 2023-11-23 DIAGNOSIS — R5081 Fever presenting with conditions classified elsewhere: Secondary | ICD-10-CM | POA: Diagnosis not present

## 2023-11-23 DIAGNOSIS — R7401 Elevation of levels of liver transaminase levels: Secondary | ICD-10-CM | POA: Diagnosis not present

## 2023-11-23 DIAGNOSIS — D61818 Other pancytopenia: Secondary | ICD-10-CM | POA: Diagnosis not present

## 2023-11-23 DIAGNOSIS — D709 Neutropenia, unspecified: Secondary | ICD-10-CM | POA: Diagnosis not present

## 2023-11-23 LAB — CBC WITH DIFFERENTIAL/PLATELET
Abs Immature Granulocytes: 0.01 10*3/uL (ref 0.00–0.07)
Basophils Absolute: 0.1 10*3/uL (ref 0.0–0.1)
Basophils Relative: 2 %
Eosinophils Absolute: 0 10*3/uL (ref 0.0–0.5)
Eosinophils Relative: 0 %
HCT: 37.1 % — ABNORMAL LOW (ref 39.0–52.0)
Hemoglobin: 12.5 g/dL — ABNORMAL LOW (ref 13.0–17.0)
Immature Granulocytes: 0 %
Lymphocytes Relative: 73 %
Lymphs Abs: 3.8 10*3/uL (ref 0.7–4.0)
MCH: 31.6 pg (ref 26.0–34.0)
MCHC: 33.7 g/dL (ref 30.0–36.0)
MCV: 93.9 fL (ref 80.0–100.0)
Monocytes Absolute: 0.3 10*3/uL (ref 0.1–1.0)
Monocytes Relative: 6 %
Neutro Abs: 1 10*3/uL — ABNORMAL LOW (ref 1.7–7.7)
Neutrophils Relative %: 19 %
Platelets: 60 10*3/uL — ABNORMAL LOW (ref 150–400)
RBC: 3.95 MIL/uL — ABNORMAL LOW (ref 4.22–5.81)
RDW: 11.7 % (ref 11.5–15.5)
WBC: 5.2 10*3/uL (ref 4.0–10.5)
nRBC: 0 % (ref 0.0–0.2)

## 2023-11-23 LAB — COMPREHENSIVE METABOLIC PANEL WITH GFR
ALT: 338 U/L — ABNORMAL HIGH (ref 0–44)
AST: 253 U/L — ABNORMAL HIGH (ref 15–41)
Albumin: 2.9 g/dL — ABNORMAL LOW (ref 3.5–5.0)
Alkaline Phosphatase: 97 U/L (ref 38–126)
Anion gap: 7 (ref 5–15)
BUN: 12 mg/dL (ref 6–20)
CO2: 25 mmol/L (ref 22–32)
Calcium: 8.2 mg/dL — ABNORMAL LOW (ref 8.9–10.3)
Chloride: 105 mmol/L (ref 98–111)
Creatinine, Ser: 0.88 mg/dL (ref 0.61–1.24)
GFR, Estimated: >60 mL/min (ref >60)
Glucose, Bld: 104 mg/dL — ABNORMAL HIGH (ref 70–99)
Potassium: 3.6 mmol/L (ref 3.5–5.1)
Sodium: 137 mmol/L (ref 135–145)
Total Bilirubin: 0.7 mg/dL (ref 0.0–1.2)
Total Protein: 5.7 g/dL — ABNORMAL LOW (ref 6.5–8.1)

## 2023-11-23 LAB — EHRLICHIA ANTIBODY PANEL
E chaffeensis (HGE) Ab, IgG: NEGATIVE
E chaffeensis (HGE) Ab, IgM: NEGATIVE
E. Chaffeensis (HME) IgM Titer: NEGATIVE
E.Chaffeensis (HME) IgG: NEGATIVE

## 2023-11-23 MED ORDER — DOXYCYCLINE HYCLATE 100 MG PO TABS
100.0000 mg | ORAL_TABLET | Freq: Two times a day (BID) | ORAL | 0 refills | Status: AC
  Filled 2023-11-23: qty 14, 7d supply, fill #0

## 2023-11-23 NOTE — Discharge Summary (Signed)
 Physician Discharge Summary   Patient: Randy Quinn MRN: [id number] DOB: [birth year]  Admit date:     11/20/2023  Discharge date: 11/23/23  Discharge Physician: Ozell Blunt   PCP: Benedetto Brady, MD   Recommendations at discharge:   Follow-up PCP in 1 week Check liver enzymes in 1 week Follow serology for ehrlichiosis/RMSF at PCP office Follow-up ID in 3 weeks to repeat tick serology testing  Discharge Diagnoses: Principal Problem:   Neutropenic fever (HCC)  Resolved Problems:   * No resolved hospital problems. *  Hospital Course: 48 y.o. male with medical history significant for melanotic nevi of the trunk, hyperlipidemia, recent treatment with Augmentin  for laceration and cellulitis March 2025 being admitted to the hospital with newly recognized neutropenia and thrombocytopenia with fever.  Patient states he has been having ongoing fever at home for the last 4 to 5 days, with Tmax of 103.   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  Sepsis - Patient empirically started on vancomycin , Zosyn  - Blood cultures x 2 negative -Will discontinue vancomycin  and Zosyn      Neutropenia with thrombocytopenia -Likely in setting of tickborne illness as above - Resolved   Transaminitis -Likely in setting of tickborne illness as above - Improving, avoid Tylenol  and Crestor  till LFTs are normal. -Follow-up with PCP in 1 week to recheck l LFT levels   Hyperlipidemia -hold statin due to abnormal LFTs   Elevated D-dimer - D-dimer elevated at 9.02 - Denies shortness of breath - Likely in  setting of tickborne illness - Venous duplex of lower extremities was negative for DVT -No signs and symptoms of pulmonary embolism.  No hypoxia         Consultants: Infectious disease Procedures performed:  Disposition: Home Diet recommendation:  Discharge Diet Orders (From admission, onward)     Start     Ordered   11/23/23 0000  Diet - low sodium heart healthy        11/23/23 1200           Regular diet DISCHARGE MEDICATION: Allergies as of 11/23/2023   No Known Allergies      Medication List     STOP taking these medications    acetaminophen  325 MG tablet Commonly known as: TYLENOL    rosuvastatin  20 MG tablet Commonly known as: CRESTOR        TAKE these medications    doxycycline  100 MG tablet Commonly known as: VIBRA -TABS Take 1 tablet (100 mg total) by mouth 2 (two) times daily for 7 days.   ibuprofen  200 MG tablet Commonly known as: ADVIL  Take 200 mg by mouth as needed.        Follow-up Information     Benedetto Brady, MD Follow up in 1 week(s).   Specialty: Family Medicine Why: Follow-up CBC/CMP in 1 week at PCP office.  Follow-up lab work for [name] spotted fever in 1 week Contact information: [address] [place] [address] [phone]                Discharge Exam: Randy Quinn Weights   11/20/23 2044 11/21/23 1158  Weight: 86.2 kg 90.3 kg  General-appears in no acute distress Heart-S1-S2, regular, no murmur auscultated Lungs-clear to auscultation bilaterally, no wheezing or crackles auscultated Abdomen-soft, nontender, no organomegaly Extremities-no edema in the lower extremities Neuro-alert, oriented x3, no focal deficit noted  Condition at discharge: good  The results of significant diagnostics from this hospitalization (including imaging, microbiology, ancillary and laboratory) are listed below for reference.   Imaging Studies: VAS US  LOWER EXTREMITY VENOUS (DVT) Result Date:  11/23/2023  Lower Venous DVT Study Patient Name:  Randy Quinn  Date of Exam:   11/22/2023 Medical Rec #: [id number]        Accession #:    [id number] Date of Birth: [birth year]       Patient Gender: M Patient Age:   56 years Exam Location:  [facility] Procedure:      VAS US  LOWER EXTREMITY VENOUS (DVT) Referring Phys: Maple Seltzer Basil Blakesley --------------------------------------------------------------------------------  Indications: Elevated D-dimer (9.02).  Comparison Study: No previous exams Performing Technologist: Jody Hill RVT, RDMS  Examination Guidelines: A complete evaluation includes B-mode imaging, spectral Doppler, color Doppler, and power Doppler as needed of all accessible portions of each vessel. Bilateral testing is considered an integral part of a complete examination. Limited examinations for reoccurring indications may be performed as noted. The reflux portion of the exam is performed with the patient in reverse Trendelenburg.  +---------+---------------+---------+-----------+----------+--------------+ RIGHT    CompressibilityPhasicitySpontaneityPropertiesThrombus Aging +---------+---------------+---------+-----------+----------+--------------+ CFV      Full           Yes      Yes                                 +---------+---------------+---------+-----------+----------+--------------+ SFJ      Full                                                        +---------+---------------+---------+-----------+----------+--------------+ FV Prox  Full           Yes      Yes                                 +---------+---------------+---------+-----------+----------+--------------+ FV Mid   Full           Yes      Yes                                 +---------+---------------+---------+-----------+----------+--------------+ FV DistalFull           Yes      Yes                                 +---------+---------------+---------+-----------+----------+--------------+  PFV      Full                                                        +---------+---------------+---------+-----------+----------+--------------+ POP      Full  Yes      Yes                                 +---------+---------------+---------+-----------+----------+--------------+ PTV      Full                                                        +---------+---------------+---------+-----------+----------+--------------+ PERO     Full                                                        +---------+---------------+---------+-----------+----------+--------------+   +---------+---------------+---------+-----------+----------+--------------+ LEFT     CompressibilityPhasicitySpontaneityPropertiesThrombus Aging +---------+---------------+---------+-----------+----------+--------------+ CFV      Full           Yes      Yes                                 +---------+---------------+---------+-----------+----------+--------------+ SFJ      Full                                                        +---------+---------------+---------+-----------+----------+--------------+ FV Prox  Full           Yes      Yes                                 +---------+---------------+---------+-----------+----------+--------------+ FV Mid   Full           Yes      Yes                                 +---------+---------------+---------+-----------+----------+--------------+ FV DistalFull           Yes      Yes                                 +---------+---------------+---------+-----------+----------+--------------+ PFV      Full                                                        +---------+---------------+---------+-----------+----------+--------------+ POP      Full           Yes      Yes                                 +---------+---------------+---------+-----------+----------+--------------+ PTV      Full                                                         +---------+---------------+---------+-----------+----------+--------------+  PERO     Full                                                        +---------+---------------+---------+-----------+----------+--------------+     Summary: BILATERAL: - No evidence of deep vein thrombosis seen in the lower extremities, bilaterally. -No evidence of popliteal cyst, bilaterally.   *See table(s) above for measurements and observations. Electronically signed by Runell Countryman on 11/23/2023 at 9:32:09 AM.    Final    CT CHEST ABDOMEN PELVIS W CONTRAST Result Date: 11/20/2023 CLINICAL DATA:  Febrile neutropenia.  Elevated LFTs. EXAM: CT CHEST, ABDOMEN, AND PELVIS WITH CONTRAST TECHNIQUE: Multidetector CT imaging of the chest, abdomen and pelvis was performed following the standard protocol during bolus administration of intravenous contrast. RADIATION DOSE REDUCTION: This exam was performed according to the departmental dose-optimization program which includes automated exposure control, adjustment of the mA and/or kV according to patient size and/or use of iterative reconstruction technique. CONTRAST:  OMNIPAQUE  IOHEXOL  300 MG/ML  SOLN COMPARISON:  None Available. FINDINGS: CT CHEST FINDINGS Cardiovascular: No significant vascular findings. Normal heart size. No pericardial effusion. Mediastinum/Nodes: There is some wall thickening of the distal esophagus. Esophagus is nondilated. There are no enlarged mediastinal or hilar lymph nodes. Visualized thyroid gland is within normal limits. Lungs/Pleura: There is a small amount of atelectasis in both lung bases. The lungs are otherwise clear. There are trace bilateral pleural effusions. Musculoskeletal: No chest wall mass or suspicious bone lesions identified. CT ABDOMEN PELVIS FINDINGS Hepatobiliary: There is a subcentimeter hypodensity in the left lobe of the liver image 2/60 which is too small to characterize. The liver is mildly  heterogeneous, but normal in size. The gallbladder and bile ducts are within normal limits. Pancreas: Unremarkable. No pancreatic ductal dilatation or surrounding inflammatory changes. Spleen: Mildly enlarged. Adrenals/Urinary Tract: There is a subcentimeter hypodensity in the right kidney which is too small to characterize, likely a cyst. Otherwise, the adrenal glands, kidneys and bladder are within normal limits. Stomach/Bowel: Stomach is within normal limits. Appendix appears normal. No evidence of bowel wall thickening, distention, or inflammatory changes. There is sigmoid colon diverticulosis. Vascular/Lymphatic: Aortic atherosclerosis. No enlarged abdominal or pelvic lymph nodes. Reproductive: Prostate is unremarkable. Other: There is a small amount of free fluid in the pelvis. There is no focal abdominal wall hernia. Musculoskeletal: No acute or significant osseous findings. IMPRESSION: 1. Trace bilateral pleural effusions with adjacent atelectasis. 2. Mild wall thickening of the distal esophagus, nonspecific. 3. Mild heterogeneity of the liver, nonspecific. 4. Mild splenomegaly. 5. Small amount of free fluid in the pelvis. 6. Sigmoid colon diverticulosis. 7. Aortic atherosclerosis. Aortic Atherosclerosis (ICD10-I70.0). Electronically Signed   By: Tyron Gallon M.D.   On: 11/20/2023 22:35    Microbiology: Results for orders placed or performed during the hospital encounter of 11/20/23  Culture, blood (Routine X 2) w Reflex to ID Panel     Status: None (Preliminary result)   Collection Time: 11/20/23 10:00 PM   Specimen: Right Antecubital; Blood  Result Value Ref Range Status   Specimen Description   Final    RIGHT ANTECUBITAL Performed at [facility], [address], [place], [place] [address]    Special Requests   Final    BOTTLES DRAWN AEROBIC AND ANAEROBIC Blood Culture adequate volume Performed at [facility], [address] [place], [place]  [address]    Culture   Final    NO GROWTH 2 DAYS Performed at [facility], [address] [place], [place] [address]    Report Status PENDING  Incomplete  Culture, blood (Routine X 2) w Reflex to ID Panel     Status: None (Preliminary result)   Collection Time: 11/20/23 10:05 PM   Specimen: BLOOD LEFT HAND  Result Value Ref Range Status   Specimen Description   Final    BLOOD LEFT HAND Performed at [facility], [address], [place], [place] [address]    Special Requests   Final    BOTTLES DRAWN AEROBIC AND ANAEROBIC Blood Culture adequate volume Performed at [facility], [address] [place], [place] [address]    Culture   Final    NO GROWTH 2 DAYS Performed at [facility], [address] [place], [place] [address]    Report Status PENDING  Incomplete  MRSA Next Gen by PCR, Nasal     Status: None   Collection Time: 11/21/23 12:03 PM   Specimen: Nasal Mucosa; Nasal Swab  Result Value Ref Range Status   MRSA by PCR Next Gen NOT DETECTED NOT DETECTED Final    Comment: (NOTE) The GeneXpert MRSA Assay (FDA approved for NASAL specimens only), is one component of a comprehensive MRSA colonization surveillance program. It is not intended to diagnose MRSA infection nor to guide or monitor treatment for MRSA infections. Test performance is not FDA approved in patients less than 44 years old. Performed at [facility], [address], [place], [place] [address]   Resp panel by RT-PCR (RSV, Flu A&B, Covid) Anterior Nasal Swab     Status: None   Collection Time: 11/21/23  1:38 PM   Specimen: Anterior Nasal Swab  Result Value Ref Range Status   SARS Coronavirus 2 by RT PCR NEGATIVE NEGATIVE Final    Comment: (NOTE) SARS-CoV-2 target nucleic acids are NOT DETECTED.  The SARS-CoV-2 RNA is generally detectable in upper respiratory specimens during the acute phase of infection. The lowest concentration of  SARS-CoV-2 viral copies this assay can detect is 138 copies/mL. A negative result does not preclude SARS-Cov-2 infection and should not be used as the sole basis for treatment or other patient management decisions. A negative result may occur with  improper specimen collection/handling, submission of specimen other than nasopharyngeal swab, presence of viral mutation(s) within the areas targeted by this assay, and inadequate number of viral copies(<138 copies/mL). A negative result must be combined with clinical observations, patient history, and epidemiological information. The expected result is Negative.  Fact Sheet for Patients:  BloggerCourse.com  Fact Sheet for Healthcare Providers:  SeriousBroker.it  This test is no t yet approved or cleared by the [place]  FDA and  has been authorized for detection and/or diagnosis of SARS-CoV-2 by FDA under an Emergency Use Authorization (EUA). This EUA will remain  in effect (meaning this test can be used) for the duration of the COVID-19 declaration under Section 564(b)(1) of the Act, 21 U.S.C.section 360bbb-3(b)(1), unless the authorization is terminated  or revoked sooner.       Influenza A by PCR NEGATIVE NEGATIVE Final   Influenza B by PCR NEGATIVE NEGATIVE Final    Comment: (NOTE) The Xpert Xpress SARS-CoV-2/FLU/RSV plus assay is intended as an aid in the diagnosis of influenza from Nasopharyngeal swab specimens and should not be used as a sole  basis for treatment. Nasal washings and aspirates are unacceptable for Xpert Xpress SARS-CoV-2/FLU/RSV testing.  Fact Sheet for Patients: BloggerCourse.com  Fact Sheet for Healthcare Providers: SeriousBroker.it  This test is not yet approved or cleared by the [place]  FDA and has been authorized for detection and/or diagnosis of SARS-CoV-2 by FDA under an Emergency Use  Authorization (EUA). This EUA will remain in effect (meaning this test can be used) for the duration of the COVID-19 declaration under Section 564(b)(1) of the Act, 21 U.S.C. section 360bbb-3(b)(1), unless the authorization is terminated or revoked.     Resp Syncytial Virus by PCR NEGATIVE NEGATIVE Final    Comment: (NOTE) Fact Sheet for Patients: BloggerCourse.com  Fact Sheet for Healthcare Providers: SeriousBroker.it  This test is not yet approved or cleared by the [place]  FDA and has been authorized for detection and/or diagnosis of SARS-CoV-2 by FDA under an Emergency Use Authorization (EUA). This EUA will remain in effect (meaning this test can be used) for the duration of the COVID-19 declaration under Section 564(b)(1) of the Act, 21 U.S.C. section 360bbb-3(b)(1), unless the authorization is terminated or revoked.  Performed at [facility], [address] [place], [place] [address]   Respiratory (~20 pathogens) panel by PCR     Status: None   Collection Time: 11/21/23  1:38 PM   Specimen: Anterior Nasal Swab; Respiratory  Result Value Ref Range Status   Adenovirus NOT DETECTED NOT DETECTED Final   Coronavirus 229E NOT DETECTED NOT DETECTED Final    Comment: (NOTE) The Coronavirus on the Respiratory Panel, DOES NOT test for the novel  Coronavirus (2019 nCoV)    Coronavirus HKU1 NOT DETECTED NOT DETECTED Final   Coronavirus NL63 NOT DETECTED NOT DETECTED Final   Coronavirus OC43 NOT DETECTED NOT DETECTED Final   Metapneumovirus NOT DETECTED NOT DETECTED Final   Rhinovirus / Enterovirus NOT DETECTED NOT DETECTED Final   Influenza A NOT DETECTED NOT DETECTED Final   Influenza B NOT DETECTED NOT DETECTED Final   Parainfluenza Virus 1 NOT DETECTED NOT DETECTED Final   Parainfluenza Virus 2 NOT DETECTED NOT DETECTED Final   Parainfluenza Virus 3 NOT DETECTED NOT DETECTED Final   Parainfluenza  Virus 4 NOT DETECTED NOT DETECTED Final   Respiratory Syncytial Virus NOT DETECTED NOT DETECTED Final   Bordetella pertussis NOT DETECTED NOT DETECTED Final   Bordetella Parapertussis NOT DETECTED NOT DETECTED Final   Chlamydophila pneumoniae NOT DETECTED NOT DETECTED Final   Mycoplasma pneumoniae NOT DETECTED NOT DETECTED Final    Comment: Performed at [facility], [address] [place], [place] [address]    Labs: CBC: Recent Labs  Lab 11/20/23 2050 11/21/23 1732 11/21/23 1733 11/21/23 1900 11/22/23 0327 11/23/23 0314  WBC 1.0* 1.9*  --  1.5* 3.0* 5.2  NEUTROABS 0.6* 0.9*  --  0.7* 1.2* 1.0*  HGB 14.7 14.4  --  13.5 13.8 12.5*  HCT 40.9 41.4  --  37.5* 41.1 37.1*  MCV 88.7 91.2  --  89.5 94.7 93.9  PLT 57* 58* 57* 51*  50* 53* 60*   Basic Metabolic Panel: Recent Labs  Lab 11/20/23 2050 11/21/23 1900 11/22/23 0327 11/23/23 0314  NA 139 137 137 137  K 3.9 3.7 4.0 3.6  CL 103 104 104 105  CO2 27 24 23 25   GLUCOSE 148* 139* 117* 104*  BUN 12 10 8 12   CREATININE 1.14 0.78 0.93 0.88  CALCIUM  8.9 8.0* 8.1* 8.2*  MG  --  1.8  --   --  PHOS  --  2.3*  --   --    Liver Function Tests: Recent Labs  Lab 11/20/23 2050 11/21/23 1900 11/22/23 0327 11/23/23 0314  AST 484* 330* 273* 253*  ALT 389* 353* 340* 338*  ALKPHOS 127* 121 120 97  BILITOT 0.8 1.0 1.1 0.7  PROT 6.0* 5.6* 5.9* 5.7*  ALBUMIN 3.8 3.0* 3.1* 2.9*   CBG: No results for input(s): "GLUCAP" in the last 168 hours.  Discharge time spent: greater than 30 minutes.  Signed: Ozell Blunt, MD [facility] 11/23/2023

## 2023-11-23 NOTE — Progress Notes (Signed)
 Id inpatient progress note   Abx: Doxy   A/p Febrile, neutropenic/thrombocytopenic/hepatitis syndrome Suspect tick borne illnessess   [facility]/[place] virus vs ehrlichia/rmsf Path smear negative   -f/u acute serology sent here -lft and sx and cellular improvement -finish another 7 days doxy -f/u pcp per patient's desire for cbc/cmp retesting -f/u with us  in around 3 weeks for serology testing -maintain standard isolation precaution -ok to discharge from id standpoint   Subjective: Felt baseline Afebrile Continued cbc/cmp improvement   Objectives: Vitals:   11/23/23 0823 11/23/23 0900  BP:  135/80  Pulse:  62  Resp:  14  Temp: 98 F (36.7 C)   SpO2:  90%   General/constitutional: no distress, pleasant HEENT: Normocephalic, PER, Conj Clear, EOMI, Oropharynx clear Neck supple CV: rrr no mrg Lungs: clear to auscultation, normal respiratory effort Abd: Soft, Nontender Ext: no edema Skin: No Rash Neuro: nonfocal MSK: no peripheral joint swelling/tenderness/warmth; back spines nontender  Labs: Lab Results  Component Value Date   WBC 5.2 11/23/2023   HGB 12.5 (L) 11/23/2023   HCT 37.1 (L) 11/23/2023   MCV 93.9 11/23/2023   PLT 60 (L) 11/23/2023   Last metabolic panel Lab Results  Component Value Date   GLUCOSE 104 (H) 11/23/2023   NA 137 11/23/2023   K 3.6 11/23/2023   CL 105 11/23/2023   CO2 25 11/23/2023   BUN 12 11/23/2023   CREATININE 0.88 11/23/2023   GFRNONAA >60 11/23/2023   CALCIUM  8.2 (L) 11/23/2023   PHOS 2.3 (L) 11/21/2023   PROT 5.7 (L) 11/23/2023   ALBUMIN 2.9 (L) 11/23/2023   BILITOT 0.7 11/23/2023   ALKPHOS 97 11/23/2023   AST 253 (H) 11/23/2023   ALT 338 (H) 11/23/2023   ANIONGAP 7 11/23/2023    Imaging: None         Jamesetta Mcbride, MD [facility] [phone]  pager   [phone] cell 11/23/2023, 11:02 AM

## 2023-11-23 NOTE — Progress Notes (Incomplete)
 [facility]  PROGRESS NOTE  ACEY MELCHER ZOX:[id number] DOB: 1976/01/10 DOA: 11/20/2023 PCP: Benedetto Brady, MD   Brief HPI:     48 y.o. male with medical history significant for melanotic nevi of the trunk, hyperlipidemia, recent treatment with Augmentin  for laceration and cellulitis March 2025 being admitted to the hospital with newly recognized neutropenia and thrombocytopenia with fever.  Patient states he has been having ongoing fever at home for the last 4 to 5 days, with Tmax of 103.    Assessment/Plan:   Tickborne illness - Likely ehrlichiosis versus RMSF - Clinical picture fits tickborne illness with leukopenia, thrombocytopenia, fever, transaminitis; patient had tick bite few days ago - Continue doxycycline  - Will consult ID for further recommendations  ?  Sepsis - Patient empirically started on vancomycin , Zosyn  - Follow blood culture results    Neutropenia with thrombocytopenia -Likely in setting of tickborne illness as above -WBC is improving -Hematology evaluation is much appreciated   Transaminitis -Likely in setting of tickborne illness as above -Follow LFTs in a.m.   Hyperlipidemia -hold statin due to abnormal LFTs  Elevated D-dimer - D-dimer elevated at 9.02 - Denies shortness of breath - Likely in setting of tickborne illness - Will check venous duplex of lower extremities rule out DVT  Medications     Chlorhexidine  Gluconate Cloth  6 each Topical Daily   doxycycline   100 mg Oral Q12H     Data Reviewed:   CBG:  No results for input(s): "GLUCAP" in the last 168 hours.  SpO2: 93 %    Vitals:   11/23/23 0600 11/23/23 0700 11/23/23 0730 11/23/23 0747  BP: 122/80 (!) 141/81 138/81   Pulse: (!) 58 65 70 70  Resp: 18 19 19  (!) 21  Temp:      TempSrc:      SpO2: 98% 95% 94% 93%  Weight:      Height:          Data Reviewed:  Basic Metabolic Panel: Recent Labs  Lab 11/20/23 2050 11/21/23 1900 11/22/23 0327 11/23/23 0314  NA  139 137 137 137  K 3.9 3.7 4.0 3.6  CL 103 104 104 105  CO2 27 24 23 25   GLUCOSE 148* 139* 117* 104*  BUN 12 10 8 12   CREATININE 1.14 0.78 0.93 0.88  CALCIUM  8.9 8.0* 8.1* 8.2*  MG  --  1.8  --   --   PHOS  --  2.3*  --   --     CBC: Recent Labs  Lab 11/20/23 2050 11/21/23 1732 11/21/23 1733 11/21/23 1900 11/22/23 0327 11/23/23 0314  WBC 1.0* 1.9*  --  1.5* 3.0* 5.2  NEUTROABS 0.6* 0.9*  --  0.7* 1.2* 1.0*  HGB 14.7 14.4  --  13.5 13.8 12.5*  HCT 40.9 41.4  --  37.5* 41.1 37.1*  MCV 88.7 91.2  --  89.5 94.7 93.9  PLT 57* 58* 57* 51*  50* 53* 60*    LFT Recent Labs  Lab 11/20/23 2050 11/21/23 1900 11/22/23 0327 11/23/23 0314  AST 484* 330* 273* 253*  ALT 389* 353* 340* 338*  ALKPHOS 127* 121 120 97  BILITOT 0.8 1.0 1.1 0.7  PROT 6.0* 5.6* 5.9* 5.7*  ALBUMIN 3.8 3.0* 3.1* 2.9*     Antibiotics: Anti-infectives (From admission, onward)    Start     Dose/Rate Route Frequency Ordered Stop   11/21/23 1700  vancomycin  (VANCOREADY) IVPB 750 mg/150 mL  Status:  Discontinued  750 mg 150 mL/hr over 60 Minutes Intravenous Every 8 hours 11/21/23 0808 11/22/23 1344   11/21/23 1145  doxycycline  (VIBRA -TABS) tablet 100 mg        100 mg Oral Every 12 hours 11/21/23 1144     11/21/23 0900  doxycycline  (VIBRA -TABS) tablet 100 mg        100 mg Oral  Once 11/21/23 0856 11/21/23 0943   11/21/23 0830  piperacillin -tazobactam (ZOSYN ) IVPB 3.375 g  Status:  Discontinued        3.375 g 12.5 mL/hr over 240 Minutes Intravenous Every 8 hours 11/21/23 0803 11/22/23 1458   11/21/23 0830  vancomycin  (VANCOCIN ) 750 mg in sodium chloride  0.9 % 250 mL IVPB        750 mg 250 mL/hr over 60 Minutes Intravenous Every 8 hours 11/21/23 0808 11/21/23 1041   11/20/23 2330  piperacillin -tazobactam (ZOSYN ) IVPB 3.375 g        3.375 g 100 mL/hr over 30 Minutes Intravenous  Once 11/20/23 2328 11/21/23 0013   11/20/23 2330  vancomycin  (VANCOCIN ) IVPB 1000 mg/200 mL premix       Placed in  "Followed by" Linked Group   1,000 mg 200 mL/hr over 60 Minutes Intravenous  Once 11/20/23 2328 11/21/23 0221   11/20/23 2330  vancomycin  (VANCOCIN ) IVPB 1000 mg/200 mL premix       Placed in "Followed by" Linked Group   1,000 mg 200 mL/hr over 60 Minutes Intravenous  Once 11/20/23 2328 11/21/23 0112        DVT prophylaxis: SCDs  Code Status: Full code  Family Communication:    CONSULTS hematology/oncology   Subjective      Objective    Physical Examination:    Status is: Inpatient:             Ozell Blunt   [facility] If 7PM-7AM, please contact night-coverage at [email or web address], Office  [phone]   11/23/2023, 8:16 AM  LOS: 1 day

## 2023-11-23 NOTE — Progress Notes (Signed)
   11/23/23 1204  AVS Discharge Documentation  AVS Discharge Instructions Including Medications Provided to patient/caregiver  Name of Person Receiving AVS Discharge Instructions Including Medications Randy Quinn  Name of Clinician That Reviewed AVS Discharge Instructions Including Medications Sheldon Deter RN

## 2023-11-23 NOTE — Progress Notes (Signed)
 Discharge medication delivered to patient at bedside [facility]

## 2023-11-26 ENCOUNTER — Telehealth: Payer: Self-pay

## 2023-11-26 DIAGNOSIS — E785 Hyperlipidemia, unspecified: Secondary | ICD-10-CM | POA: Diagnosis not present

## 2023-11-26 LAB — CULTURE, BLOOD (ROUTINE X 2)
Culture: NO GROWTH
Culture: NO GROWTH
Special Requests: ADEQUATE
Special Requests: ADEQUATE

## 2023-11-26 NOTE — Telephone Encounter (Signed)
 Patient wife [name] called concerned stating that patient drenched the sheets in their bed last night but isn't running a fever. [name] also stated that they haven't received the results from the [facility] serology?    Randy Quinn, CMA

## 2023-11-26 NOTE — Telephone Encounter (Signed)
 Advise them he can use ibuprofen  for fever/cold sweat as needed  Repeat labs with PCP and we can check another set on follow up  Erlichiosis test was negative and will see on repeat if there is a rise. The team didn't check [place] spotted fever but can check on follow up  Thanks

## 2023-11-27 DIAGNOSIS — R509 Fever, unspecified: Secondary | ICD-10-CM | POA: Diagnosis not present

## 2023-11-27 DIAGNOSIS — D696 Thrombocytopenia, unspecified: Secondary | ICD-10-CM | POA: Diagnosis not present

## 2023-12-06 DIAGNOSIS — A7982 Anaplasmosis (a. phagocytophilum): Secondary | ICD-10-CM | POA: Diagnosis not present

## 2023-12-06 DIAGNOSIS — E785 Hyperlipidemia, unspecified: Secondary | ICD-10-CM | POA: Diagnosis not present

## 2023-12-06 DIAGNOSIS — R945 Abnormal results of liver function studies: Secondary | ICD-10-CM | POA: Diagnosis not present

## 2023-12-06 DIAGNOSIS — A7741 Ehrlichiosis chafeensis [E. chafeensis]: Secondary | ICD-10-CM | POA: Diagnosis not present

## 2023-12-13 ENCOUNTER — Other Ambulatory Visit: Payer: Self-pay

## 2023-12-13 ENCOUNTER — Encounter: Payer: Self-pay | Admitting: Infectious Diseases

## 2023-12-13 ENCOUNTER — Ambulatory Visit (INDEPENDENT_AMBULATORY_CARE_PROVIDER_SITE_OTHER): Payer: Self-pay | Admitting: Infectious Diseases

## 2023-12-13 VITALS — BP 130/83 | HR 67 | Temp 98.2°F | Wt 195.4 lb

## 2023-12-13 DIAGNOSIS — Z79899 Other long term (current) drug therapy: Secondary | ICD-10-CM | POA: Diagnosis not present

## 2023-12-13 DIAGNOSIS — B882 Other arthropod infestations: Secondary | ICD-10-CM | POA: Diagnosis not present

## 2023-12-13 DIAGNOSIS — D696 Thrombocytopenia, unspecified: Secondary | ICD-10-CM | POA: Diagnosis not present

## 2023-12-13 DIAGNOSIS — R7401 Elevation of levels of liver transaminase levels: Secondary | ICD-10-CM

## 2023-12-13 NOTE — Progress Notes (Addendum)
 Patient Active Problem List   Diagnosis Date Noted   Neutropenic fever (HCC) 11/20/2023   Tear of articular cartilage of right knee as current injury 03/06/2014   Right knee pain 02/03/2014   Strain of right quadriceps tendon 12/02/2013   Strain of left iliopsoas muscle 03/13/2013   Somatic dysfunction of pelvic region 03/13/2013   BURSITIS, CALCANEAL 04/05/2009   No current outpatient medications on file prior to visit.   No current facility-administered medications on file prior to visit.    Subjective: Discussed the use of AI scribe software for clinical note transcription with the patient, who gave verbal consent to proceed.   [name] with PMH of metastatic nevi of trunk, HLD who is here after recent hospital admission 5/7-5/9 for possible tick borne illness. Patient initially presented with fevers with labs remarkable for leukopenia, thrombocytopenia and transaminitis. Initially started on Vancomycin  and zosyn  later transitioned to PO doxycycline  due to concerns for ehrlichiosis and was discharged on 5/9 to complete course of PO doxycycline  after clinical improvement of fevers, cytopenia and liver enzymes.     He is accompanied by his wife. Reports he has completed course of PO doxycycline  without missing doses. Reports all of his symptoms have resolved and feels back to normal. He has followed up with his PCP and had a blood work done with positive serology for ehrlichiosis/anaplasmosis.   No other complaints  Review of Systems: all systems reviewed with pertinent positives and negatives as listed above   Past Medical History:  Diagnosis Date   Dysplastic nevi    Hyperlipidemia    Past Surgical History:  Procedure Laterality Date   KNEE SURGERY      Social History   Tobacco Use   Smoking status: Never   Smokeless tobacco: Never   Tobacco comments:    Dip in HS for very short period   Substance Use Topics   Alcohol use: Yes    Comment: several times a  week   Drug use: Never    Family History  Problem Relation Age of Onset   Colon cancer Neg Hx    Colon polyps Neg Hx    Esophageal cancer Neg Hx    Stomach cancer Neg Hx    Rectal cancer Neg Hx     No Known Allergies  Health Maintenance  Topic Date Due   DTaP/Tdap/Td (1 - Tdap) Never done   Pneumococcal Vaccine 71-5 Years old (1 of 2 - PCV) Never done   COVID-19 Vaccine (4 - 2024-25 season) 03/18/2023   INFLUENZA VACCINE  02/15/2024   Colonoscopy  11/09/2031   Hepatitis C Screening  Completed   HIV Screening  Completed   HPV VACCINES  Aged Out   Meningococcal B Vaccine  Aged Out    Objective: BP 130/83   Pulse 67   Temp 98.2 F (36.8 C) (Oral)   Wt 195 lb 6.4 oz (88.6 kg)   SpO2 96%   BMI 28.04 kg/m    Physical Exam Constitutional:      Appearance: Normal appearance.  HENT:     Head: Normocephalic and atraumatic.      Mouth: Mucous membranes are moist.  Eyes:    Conjunctiva/sclera: Conjunctivae normal.     Pupils: Pupils are equal, round, and b/l symmetrical    Cardiovascular:     Rate and Rhythm: Normal rate and regular rhythm.     Heart sounds: s1s2  Pulmonary:     Effort: Pulmonary effort  is normal.     Breath sounds: Normal breath sounds.   Abdominal:     General: Non distended     Palpations: soft, non tender   Musculoskeletal:        General: Normal range of motion.   Skin:    General: Skin is warm and dry.     Comments:  Neurological:     General: grossly non focal     Mental Status: awake, alert and oriented to person, place, and time.   Psychiatric:        Mood and Affect: Mood normal.   Lab Results Lab Results  Component Value Date   WBC 5.2 11/23/2023   HGB 12.5 (L) 11/23/2023   HCT 37.1 (L) 11/23/2023   MCV 93.9 11/23/2023   PLT 60 (L) 11/23/2023    Lab Results  Component Value Date   CREATININE 0.88 11/23/2023   BUN 12 11/23/2023   NA 137 11/23/2023   K 3.6 11/23/2023   CL 105 11/23/2023   CO2 25 11/23/2023     Lab Results  Component Value Date   ALT 338 (H) 11/23/2023   AST 253 (H) 11/23/2023   ALKPHOS 97 11/23/2023   BILITOT 0.7 11/23/2023    No results found for: "CHOL", "HDL", "LDLCALC", "LDLDIRECT", "TRIG", "CHOLHDL" No results found for: "LABRPR", "RPRTITER" No results found for: "HIV1RNAQUANT", "HIV1RNAVL", "CD4TABS"          Microbiology Results for orders placed or performed during the hospital encounter of 11/20/23  Culture, blood (Routine X 2) w Reflex to ID Panel     Status: None   Collection Time: 11/20/23 10:00 PM   Specimen: Right Antecubital; Blood  Result Value Ref Range Status   Specimen Description   Final    RIGHT ANTECUBITAL Performed at [facility], [address], [place], [place] [address]    Special Requests   Final    BOTTLES DRAWN AEROBIC AND ANAEROBIC Blood Culture adequate volume Performed at [facility], [address], [place], [place] [address]    Culture   Final    NO GROWTH 5 DAYS Performed at [facility], [address] [place], [place] [address]    Report Status 11/26/2023 FINAL  Final  Culture, blood (Routine X 2) w Reflex to ID Panel     Status: None   Collection Time: 11/20/23 10:05 PM   Specimen: BLOOD LEFT HAND  Result Value Ref Range Status   Specimen Description   Final    BLOOD LEFT HAND Performed at [facility], [address] [place], [place] [address]    Special Requests   Final    BOTTLES DRAWN AEROBIC AND ANAEROBIC Blood Culture adequate volume Performed at [facility], [address], [place], [place] [address]    Culture   Final    NO GROWTH 5 DAYS Performed at [facility], [address] [place], [place] [address]    Report Status 11/26/2023 FINAL  Final  MRSA Next Gen by PCR, Nasal     Status: None   Collection Time: 11/21/23 12:03 PM   Specimen: Nasal Mucosa; Nasal Swab  Result Value Ref Range Status   MRSA by  PCR Next Gen NOT DETECTED NOT DETECTED Final    Comment: (NOTE) The GeneXpert MRSA Assay (FDA approved for NASAL specimens only), is one component of a comprehensive MRSA colonization surveillance program. It is not intended to diagnose MRSA infection nor to guide or monitor treatment  for MRSA infections. Test performance is not FDA approved in patients less than 2 years old. Performed at [facility], [address] [place], [place] [address]   Resp panel by RT-PCR (RSV, Flu A&B, Covid) Anterior Nasal Swab     Status: None   Collection Time: 11/21/23  1:38 PM   Specimen: Anterior Nasal Swab  Result Value Ref Range Status   SARS Coronavirus 2 by RT PCR NEGATIVE NEGATIVE Final    Comment: (NOTE) SARS-CoV-2 target nucleic acids are NOT DETECTED.  The SARS-CoV-2 RNA is generally detectable in upper respiratory specimens during the acute phase of infection. The lowest concentration of SARS-CoV-2 viral copies this assay can detect is 138 copies/mL. A negative result does not preclude SARS-Cov-2 infection and should not be used as the sole basis for treatment or other patient management decisions. A negative result may occur with  improper specimen collection/handling, submission of specimen other than nasopharyngeal swab, presence of viral mutation(s) within the areas targeted by this assay, and inadequate number of viral copies(<138 copies/mL). A negative result must be combined with clinical observations, patient history, and epidemiological information. The expected result is Negative.  Fact Sheet for Patients:  BloggerCourse.com  Fact Sheet for Healthcare Providers:  SeriousBroker.it  This test is no t yet approved or cleared by the [place]  FDA and  has been authorized for detection and/or diagnosis of SARS-CoV-2 by FDA under an Emergency Use Authorization (EUA). This EUA will remain  in effect (meaning  this test can be used) for the duration of the COVID-19 declaration under Section 564(b)(1) of the Act, 21 U.S.C.section 360bbb-3(b)(1), unless the authorization is terminated  or revoked sooner.       Influenza A by PCR NEGATIVE NEGATIVE Final   Influenza B by PCR NEGATIVE NEGATIVE Final    Comment: (NOTE) The Xpert Xpress SARS-CoV-2/FLU/RSV plus assay is intended as an aid in the diagnosis of influenza from Nasopharyngeal swab specimens and should not be used as a sole basis for treatment. Nasal washings and aspirates are unacceptable for Xpert Xpress SARS-CoV-2/FLU/RSV testing.  Fact Sheet for Patients: BloggerCourse.com  Fact Sheet for Healthcare Providers: SeriousBroker.it  This test is not yet approved or cleared by the [place]  FDA and has been authorized for detection and/or diagnosis of SARS-CoV-2 by FDA under an Emergency Use Authorization (EUA). This EUA will remain in effect (meaning this test can be used) for the duration of the COVID-19 declaration under Section 564(b)(1) of the Act, 21 U.S.C. section 360bbb-3(b)(1), unless the authorization is terminated or revoked.     Resp Syncytial Virus by PCR NEGATIVE NEGATIVE Final    Comment: (NOTE) Fact Sheet for Patients: BloggerCourse.com  Fact Sheet for Healthcare Providers: SeriousBroker.it  This test is not yet approved or cleared by the [place]  FDA and has been authorized for detection and/or diagnosis of SARS-CoV-2 by FDA under an Emergency Use Authorization (EUA). This EUA will remain in effect (meaning this test can be used) for the duration of the COVID-19 declaration under Section 564(b)(1) of the Act, 21 U.S.C. section 360bbb-3(b)(1), unless the authorization is terminated or revoked.  Performed at [facility], [address], [place], [place] [address]   Respiratory (~20  pathogens) panel by PCR     Status: None   Collection Time: 11/21/23  1:38 PM   Specimen: Anterior Nasal Swab; Respiratory  Result Value Ref Range Status   Adenovirus NOT DETECTED NOT DETECTED Final   Coronavirus 229E NOT DETECTED NOT  DETECTED Final    Comment: (NOTE) The Coronavirus on the Respiratory Panel, DOES NOT test for the novel  Coronavirus (2019 nCoV)    Coronavirus HKU1 NOT DETECTED NOT DETECTED Final   Coronavirus NL63 NOT DETECTED NOT DETECTED Final   Coronavirus OC43 NOT DETECTED NOT DETECTED Final   Metapneumovirus NOT DETECTED NOT DETECTED Final   Rhinovirus / Enterovirus NOT DETECTED NOT DETECTED Final   Influenza A NOT DETECTED NOT DETECTED Final   Influenza B NOT DETECTED NOT DETECTED Final   Parainfluenza Virus 1 NOT DETECTED NOT DETECTED Final   Parainfluenza Virus 2 NOT DETECTED NOT DETECTED Final   Parainfluenza Virus 3 NOT DETECTED NOT DETECTED Final   Parainfluenza Virus 4 NOT DETECTED NOT DETECTED Final   Respiratory Syncytial Virus NOT DETECTED NOT DETECTED Final   Bordetella pertussis NOT DETECTED NOT DETECTED Final   Bordetella Parapertussis NOT DETECTED NOT DETECTED Final   Chlamydophila pneumoniae NOT DETECTED NOT DETECTED Final   Mycoplasma pneumoniae NOT DETECTED NOT DETECTED Final    Comment: Performed at [facility], [address], [place], [place] [address]   Imaging VAS US  LOWER EXTREMITY VENOUS (DVT) Result Date: 11/23/2023  Lower Venous DVT Study Patient Name:  Randy [facility]  Date of Exam:   11/22/2023 Medical Rec #: [id number]        Accession #:    [id number] Date of Birth: 1976-03-18       Patient Gender: M Patient Age:   52 years Exam Location:  [facility] Procedure:      VAS US  LOWER EXTREMITY VENOUS (DVT) Referring Phys: Maple Seltzer LAMA --------------------------------------------------------------------------------  Indications: Elevated D-dimer (9.02).  Comparison Study: No previous exams Performing Technologist: Jody Hill RVT,  RDMS  Examination Guidelines: A complete evaluation includes B-mode imaging, spectral Doppler, color Doppler, and power Doppler as needed of all accessible portions of each vessel. Bilateral testing is considered an integral part of a complete examination. Limited examinations for reoccurring indications may be performed as noted. The reflux portion of the exam is performed with the patient in reverse Trendelenburg.  +---------+---------------+---------+-----------+----------+--------------+ RIGHT    CompressibilityPhasicitySpontaneityPropertiesThrombus Aging +---------+---------------+---------+-----------+----------+--------------+ CFV      Full           Yes      Yes                                 +---------+---------------+---------+-----------+----------+--------------+ SFJ      Full                                                        +---------+---------------+---------+-----------+----------+--------------+ FV Prox  Full           Yes      Yes                                 +---------+---------------+---------+-----------+----------+--------------+ FV Mid   Full           Yes      Yes                                 +---------+---------------+---------+-----------+----------+--------------+ FV DistalFull  Yes      Yes                                 +---------+---------------+---------+-----------+----------+--------------+ PFV      Full                                                        +---------+---------------+---------+-----------+----------+--------------+ POP      Full           Yes      Yes                                 +---------+---------------+---------+-----------+----------+--------------+ PTV      Full                                                        +---------+---------------+---------+-----------+----------+--------------+ PERO     Full                                                         +---------+---------------+---------+-----------+----------+--------------+   +---------+---------------+---------+-----------+----------+--------------+ LEFT     CompressibilityPhasicitySpontaneityPropertiesThrombus Aging +---------+---------------+---------+-----------+----------+--------------+ CFV      Full           Yes      Yes                                 +---------+---------------+---------+-----------+----------+--------------+ SFJ      Full                                                        +---------+---------------+---------+-----------+----------+--------------+ FV Prox  Full           Yes      Yes                                 +---------+---------------+---------+-----------+----------+--------------+ FV Mid   Full           Yes      Yes                                 +---------+---------------+---------+-----------+----------+--------------+ FV DistalFull           Yes      Yes                                 +---------+---------------+---------+-----------+----------+--------------+ PFV      Full                                                        +---------+---------------+---------+-----------+----------+--------------+  POP      Full           Yes      Yes                                 +---------+---------------+---------+-----------+----------+--------------+ PTV      Full                                                        +---------+---------------+---------+-----------+----------+--------------+ PERO     Full                                                        +---------+---------------+---------+-----------+----------+--------------+     Summary: BILATERAL: - No evidence of deep vein thrombosis seen in the lower extremities, bilaterally. -No evidence of popliteal cyst, bilaterally.   *See table(s) above for measurements and observations. Electronically signed by Runell Countryman on 11/23/2023 at 9:32:09 AM.     Final    CT CHEST ABDOMEN PELVIS W CONTRAST Result Date: 11/20/2023 CLINICAL DATA:  Febrile neutropenia.  Elevated LFTs. EXAM: CT CHEST, ABDOMEN, AND PELVIS WITH CONTRAST TECHNIQUE: Multidetector CT imaging of the chest, abdomen and pelvis was performed following the standard protocol during bolus administration of intravenous contrast. RADIATION DOSE REDUCTION: This exam was performed according to the departmental dose-optimization program which includes automated exposure control, adjustment of the mA and/or kV according to patient size and/or use of iterative reconstruction technique. CONTRAST:  OMNIPAQUE  IOHEXOL  300 MG/ML  SOLN COMPARISON:  None Available. FINDINGS: CT CHEST FINDINGS Cardiovascular: No significant vascular findings. Normal heart size. No pericardial effusion. Mediastinum/Nodes: There is some wall thickening of the distal esophagus. Esophagus is nondilated. There are no enlarged mediastinal or hilar lymph nodes. Visualized thyroid gland is within normal limits. Lungs/Pleura: There is a small amount of atelectasis in both lung bases. The lungs are otherwise clear. There are trace bilateral pleural effusions. Musculoskeletal: No chest wall mass or suspicious bone lesions identified. CT ABDOMEN PELVIS FINDINGS Hepatobiliary: There is a subcentimeter hypodensity in the left lobe of the liver image 2/60 which is too small to characterize. The liver is mildly heterogeneous, but normal in size. The gallbladder and bile ducts are within normal limits. Pancreas: Unremarkable. No pancreatic ductal dilatation or surrounding inflammatory changes. Spleen: Mildly enlarged. Adrenals/Urinary Tract: There is a subcentimeter hypodensity in the right kidney which is too small to characterize, likely a cyst. Otherwise, the adrenal glands, kidneys and bladder are within normal limits. Stomach/Bowel: Stomach is within normal limits. Appendix appears normal. No evidence of bowel wall thickening, distention, or  inflammatory changes. There is sigmoid colon diverticulosis. Vascular/Lymphatic: Aortic atherosclerosis. No enlarged abdominal or pelvic lymph nodes. Reproductive: Prostate is unremarkable. Other: There is a small amount of free fluid in the pelvis. There is no focal abdominal wall hernia. Musculoskeletal: No acute or significant osseous findings. IMPRESSION: 1. Trace bilateral pleural effusions with adjacent atelectasis. 2. Mild wall thickening of the distal esophagus, nonspecific. 3. Mild heterogeneity of the liver, nonspecific. 4. Mild splenomegaly. 5. Small amount of free fluid in the pelvis. 6. Sigmoid colon diverticulosis. 7. Aortic  atherosclerosis. Aortic Atherosclerosis (ICD10-I70.0). Electronically Signed   By: Tyron Gallon M.D.   On: 11/20/2023 22:35     Assessment/Plan # Tick borne illness( Ehrlichiosis/anaplasmosis) - s/p 10 days course of doxycycline  and clinically improved - labs done at PCP reviewed ( see media) - Fu as needed  # Transaminitis  - Liver enzymes stable/improving, has not normalized  - has a fu with PCP for repeat lab work next week - Statin on hold due to elevated liver enzymes, fu with PCP for resuming   # Thrombocytopenia  - resolved   I spent 40 minutes involved in face-to-face and non-face-to-face activities for this patient on the day of the visit. Professional time spent includes the following activities: Preparing to see the patient (review of tests), Obtaining and reviewing separately obtained history (admission 5/7/discharge record 5/9), Performing a medically appropriate examination and evaluation ,  Documenting clinical information in the EMR, Independently interpreting results (not separately reported), Communicating results to the patient/wife, Counseling and educating the patient/wife and Care coordination (not separately reported).   Of note, portions of this note may have been created with voice recognition software. While this note has been edited  for accuracy, occasional wrong-word or 'sound-a-like' substitutions may have occurred due to the inherent limitations of voice recognition software.   Melvina Stage, MD [facility] 12/13/2023, 8:57 AM

## 2023-12-15 DIAGNOSIS — R7401 Elevation of levels of liver transaminase levels: Secondary | ICD-10-CM | POA: Insufficient documentation

## 2023-12-15 DIAGNOSIS — D696 Thrombocytopenia, unspecified: Secondary | ICD-10-CM | POA: Insufficient documentation

## 2023-12-18 DIAGNOSIS — E785 Hyperlipidemia, unspecified: Secondary | ICD-10-CM | POA: Diagnosis not present

## 2023-12-18 DIAGNOSIS — R945 Abnormal results of liver function studies: Secondary | ICD-10-CM | POA: Diagnosis not present

## 2024-02-18 DIAGNOSIS — E7800 Pure hypercholesterolemia, unspecified: Secondary | ICD-10-CM | POA: Diagnosis not present

## 2024-04-11 ENCOUNTER — Encounter (HOSPITAL_COMMUNITY): Payer: Self-pay

## 2024-04-11 ENCOUNTER — Ambulatory Visit (HOSPITAL_COMMUNITY)
Admission: RE | Admit: 2024-04-11 | Discharge: 2024-04-11 | Disposition: A | Discharge status: Home / Self Care | Source: Ambulatory Visit | Attending: Emergency Medicine | Admitting: Emergency Medicine

## 2024-04-11 VITALS — BP 148/86 | HR 72 | Temp 98.2°F | Resp 14

## 2024-04-11 DIAGNOSIS — S9030XA Contusion of unspecified foot, initial encounter: Secondary | ICD-10-CM | POA: Diagnosis not present

## 2024-04-11 DIAGNOSIS — X58XXXA Exposure to other specified factors, initial encounter: Secondary | ICD-10-CM | POA: Diagnosis not present

## 2024-04-11 DIAGNOSIS — T148XXA Other injury of unspecified body region, initial encounter: Secondary | ICD-10-CM | POA: Diagnosis not present

## 2024-04-11 DIAGNOSIS — L03119 Cellulitis of unspecified part of limb: Secondary | ICD-10-CM | POA: Diagnosis not present

## 2024-04-11 LAB — COMPREHENSIVE METABOLIC PANEL WITH GFR
ALT: 28 U/L (ref 0–44)
AST: 41 U/L (ref 15–41)
Albumin: 4.2 g/dL (ref 3.5–5.0)
Alkaline Phosphatase: 47 U/L (ref 38–126)
Anion gap: 12 (ref 5–15)
BUN: 17 mg/dL (ref 6–20)
CO2: 23 mmol/L (ref 22–32)
Calcium: 9.4 mg/dL (ref 8.9–10.3)
Chloride: 102 mmol/L (ref 98–111)
Creatinine, Ser: 0.79 mg/dL (ref 0.61–1.24)
GFR, Estimated: >60 mL/min (ref >60)
Glucose, Bld: 95 mg/dL (ref 70–99)
Potassium: 4 mmol/L (ref 3.5–5.1)
Sodium: 137 mmol/L (ref 135–145)
Total Bilirubin: 1.1 mg/dL (ref 0.0–1.2)
Total Protein: 7.1 g/dL (ref 6.5–8.1)

## 2024-04-11 LAB — CBC WITH DIFFERENTIAL/PLATELET
Abs Immature Granulocytes: 0.01 K/uL (ref 0.00–0.07)
Basophils Absolute: 0 K/uL (ref 0.0–0.1)
Basophils Relative: 0 %
Eosinophils Absolute: 0.1 K/uL (ref 0.0–0.5)
Eosinophils Relative: 2 %
HCT: 42.1 % (ref 39.0–52.0)
Hemoglobin: 15.1 g/dL (ref 13.0–17.0)
Immature Granulocytes: 0 %
Lymphocytes Relative: 39 %
Lymphs Abs: 1.7 K/uL (ref 0.7–4.0)
MCH: 31.9 pg (ref 26.0–34.0)
MCHC: 35.9 g/dL (ref 30.0–36.0)
MCV: 88.8 fL (ref 80.0–100.0)
Monocytes Absolute: 0.3 K/uL (ref 0.1–1.0)
Monocytes Relative: 8 %
Neutro Abs: 2.3 K/uL (ref 1.7–7.7)
Neutrophils Relative %: 51 %
Platelets: 175 K/uL (ref 150–400)
RBC: 4.74 MIL/uL (ref 4.22–5.81)
RDW: 12.1 % (ref 11.5–15.5)
WBC: 4.5 K/uL (ref 4.0–10.5)
nRBC: 0 % (ref 0.0–0.2)

## 2024-04-11 MED ORDER — CIPROFLOXACIN HCL 500 MG PO TABS: 500.0000 mg | ORAL_TABLET | Freq: Two times a day (BID) | ORAL | 0 refills | Status: AC

## 2024-04-11 MED ORDER — DOXYCYCLINE HYCLATE 100 MG PO CAPS: 100.0000 mg | ORAL_CAPSULE | Freq: Two times a day (BID) | ORAL | 0 refills | Status: AC

## 2024-04-11 NOTE — ED Triage Notes (Signed)
 Patient reports that he returned from a fishing trip today and has bruising/rash to bilateral feet and up the back of his calves . Patient denies any itching or pain.  Patient states his ankles are slightly swollen.

## 2024-04-11 NOTE — Discharge Instructions (Signed)
 Start taking doxycycline  twice daily for 10 days. Also start taking ciprofloxacin  twice daily for 5 days. We have drawn some recent labs today and if any of these results are concerning someone will call to let you know. Within the next 24 hours if you develop spreading of redness or bruising, increased swelling, increased pain, fever, body aches, chills, or weakness please seek immediate medical treatment in the emergency department. Otherwise follow-up with your primary care provider or return here as needed.

## 2024-04-11 NOTE — ED Provider Notes (Signed)
 [facility]    CSN: [id number] Arrival date & time: 04/11/24  8144      History   Chief Complaint Chief Complaint  Patient presents with   Rash    bilateral foot red[place].  Doctor friend worried about vibrio/cellulitisI had several tick born illnesses in the spring in which I was at [employer] -anaplasmosis and ehrlichiosis. Labs were a mess for a while. - Entered by patient    HPI Randy Quinn is a 48 y.o. male.   Patient presents with rash and bruising to his lower legs that began on 9/24.  Patient states that [place] and just returned from this yesterday.  Patient states that on 9/24 he initially noticed some redness to the top of his left foot and then noticed redness to his right foot and over the last couple days it has become more like bruising and then he noticed some red patches to bilateral calves as well.  Patient denies any pain or itching to these areas.  Patient also denies any drainage from these areas.  Patient states that he initially thought the redness was from a sunburn, but then became more concerned when it started having a bruising appearance and was spreading.  Patient denies any fever, body aches, or chills.  Patient's wife reports that patient has also been treated over the last few months for tickborne illnesses and his lab work was concerning during this.  Patient's wife is requesting repeat of lab work to ensure that this is not related to or has been affected by these new symptoms.  Patient denies any of his friends who are also on the trip with him having similar symptoms.  The history is provided by the patient and medical records.  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 Authorizing Provider  atorvastatin (LIPITOR) 40 MG tablet Take 40 mg by mouth daily.   Yes [provider]  ciprofloxacin  (CIPRO ) 500 MG tablet Take 1 tablet (500 mg total) by mouth every 12 (twelve) hours. 04/11/24  Yes Johnie, Jaylenne Hamelin A, NP  doxycycline  (VIBRAMYCIN ) 100 MG capsule Take 1 capsule (100 mg total) by mouth 2 (two) times daily. 04/11/24  Yes Johnie Rumaldo LABOR, NP    Family History Family History  Problem Relation Age of Onset   Cancer Mother    Colon cancer Neg Hx    Colon polyps Neg Hx    Esophageal cancer Neg Hx    Stomach cancer Neg Hx    Rectal cancer Neg Hx     Social History Social History   Tobacco Use   Smoking status: Never  Smokeless tobacco: Never   Tobacco comments:    Dip in HS for very short period   Vaping Use   Vaping status: Never Used  Substance Use Topics   Alcohol use: Yes    Comment: several times a week   Drug use: Never     Allergies   Patient has no known allergies.   Review of Systems Review of Systems  Skin:  Positive for rash.   Per HPI  Physical Exam Triage Vital Signs ED Triage Vitals  Encounter Vitals Group     BP 04/11/24 1905 (!) 148/86     Girls Systolic BP Percentile --      Girls Diastolic BP Percentile --      Boys Systolic BP Percentile --      Boys Diastolic BP Percentile --      Pulse Rate 04/11/24 1905 72     Resp 04/11/24 1905 14     Temp 04/11/24 1905 98.2 F (36.8 C)      Temp src --      SpO2 04/11/24 1905 94 %     Weight --      Height --      Head Circumference --      Peak Flow --      Pain Score 04/11/24 1904 0     Pain Loc --      Pain Education --      Exclude from Growth Chart --    No data found.  Updated Vital Signs BP (!) 148/86 (BP Location: Left Arm)   Pulse 72   Temp 98.2 F (36.8 C)   Resp 14   SpO2 94%   Visual Acuity Right Eye Distance:   Left Eye Distance:   Bilateral Distance:    Right Eye Near:   Left Eye Near:    Bilateral Near:     Physical Exam Vitals and nursing note reviewed.  Constitutional:      General: He is awake. He is not in acute distress.    Appearance: Normal appearance. He is well-developed and well-groomed. He is not ill-appearing.  Cardiovascular:     Pulses:          Dorsalis pedis pulses are 2+ on the right side and 2+ on the left side.       Posterior tibial pulses are 2+ on the right side and 2+ on the left side.  Musculoskeletal:     Right lower leg: No edema.     Left lower leg: No edema.  Skin:    Findings: Bruising and rash present.     Comments: Bruising noted to bilateral feet extending into ankles.  There are also erythematous patches noted to bilateral posterior lower legs.  Pictures below  Neurological:     Mental Status: He is alert.  Psychiatric:        Behavior: Behavior is cooperative.               UC Treatments / Results  Labs (all labs ordered are listed, but only abnormal results are displayed) Labs Reviewed  CBC WITH DIFFERENTIAL/PLATELET  COMPREHENSIVE METABOLIC PANEL WITH GFR    EKG   Radiology No results found.  Procedures Procedures (including critical care time)  Medications Ordered in UC Medications - No data to display  Initial Impression / Assessment and Plan / UC Course  I have reviewed the triage vital signs and the nursing notes.  Pertinent labs & imaging results that were available during my  care of the patient were reviewed by  me and considered in my medical decision making (see chart for details).     Patient is overall well-appearing.  Vitals are stable.  Exam findings concerning for cellulitis specifically related to vibrio due to patient [place] the ocean.  Prescribed doxycycline  and ciprofloxacin  for dual coverage related to this.  Ordered CBC and CMP to evaluate blood counts, platelets, electrolytes, and renal function.  Discussed follow-up, return, and strict ER precautions. Final Clinical Impressions(s) / UC Diagnoses   Final diagnoses:  Cellulitis of lower extremity, unspecified laterality  Bruising     Discharge Instructions      Start taking doxycycline  twice daily for 10 days. Also start taking ciprofloxacin  twice daily for 5 days. We have drawn some recent labs today and if any of these results are concerning someone will call to let you know. Within the next 24 hours if you develop spreading of redness or bruising, increased swelling, increased pain, fever, body aches, chills, or weakness please seek immediate medical treatment in the emergency department. Otherwise follow-up with your primary care provider or return here as needed.     ED Prescriptions     Medication Sig Dispense Auth. Provider   doxycycline  (VIBRAMYCIN ) 100 MG capsule Take 1 capsule (100 mg total) by mouth 2 (two) times daily. 20 capsule Johnie Flaming A, NP   ciprofloxacin  (CIPRO ) 500 MG tablet Take 1 tablet (500 mg total) by mouth every 12 (twelve) hours. 10 tablet Johnie Flaming A, NP      PDMP not reviewed this encounter.   Johnie Flaming A, NP 04/11/24 1950

## 2024-04-14 ENCOUNTER — Ambulatory Visit (HOSPITAL_COMMUNITY): Payer: Self-pay

## 2024-04-16 DIAGNOSIS — L03116 Cellulitis of left lower limb: Secondary | ICD-10-CM | POA: Diagnosis not present

## 2024-04-16 DIAGNOSIS — Z23 Encounter for immunization: Secondary | ICD-10-CM | POA: Diagnosis not present

## 2024-04-16 DIAGNOSIS — L03115 Cellulitis of right lower limb: Secondary | ICD-10-CM | POA: Diagnosis not present

## 2024-07-21 ENCOUNTER — Telehealth: Admitting: Physician Assistant

## 2024-07-21 DIAGNOSIS — B029 Zoster without complications: Secondary | ICD-10-CM | POA: Diagnosis not present

## 2024-07-21 MED ORDER — VALACYCLOVIR HCL 1 G PO TABS: 1000.0000 mg | ORAL_TABLET | Freq: Three times a day (TID) | ORAL | 0 refills | Status: AC

## 2024-07-21 NOTE — Patient Instructions (Signed)
 " Randy Quinn, thank you for joining Randy CHRISTELLA Dickinson, PA-C for today's virtual visit.  While this provider is not your primary care provider (PCP), if your PCP is located in our provider database this encounter information will be shared with them immediately following your visit.   A [facility] MyChart account gives you access to today's visit and all your visits, tests, and labs performed at [facility]  click here if you don't have a [facility] MyChart account or go to [email or web address]  Consent: (Patient) Randy Quinn provided verbal consent for this virtual visit at the beginning of the encounter.  Current Medications:  Current Outpatient Medications:    valACYclovir  (VALTREX ) 1000 MG tablet, Take 1 tablet (1,000 mg total) by mouth 3 (three) times daily for 7 days., Disp: 21 tablet, Rfl: 0   atorvastatin (LIPITOR) 40 MG tablet, Take 40 mg by mouth daily., Disp: , Rfl:    ciprofloxacin  (CIPRO ) 500 MG tablet, Take 1 tablet (500 mg total) by mouth every 12 (twelve) hours., Disp: 10 tablet, Rfl: 0   doxycycline  (VIBRAMYCIN ) 100 MG capsule, Take 1 capsule (100 mg total) by mouth 2 (two) times daily., Disp: 20 capsule, Rfl: 0   Medications ordered in this encounter:  Meds ordered this encounter  Medications   valACYclovir  (VALTREX ) 1000 MG tablet    Sig: Take 1 tablet (1,000 mg total) by mouth 3 (three) times daily for 7 days.    Dispense:  21 tablet    Refill:  0    Supervising Provider:   BLAISE ALEENE KIDD [[id number]]     *If you need refills on other medications prior to your next appointment, please contact your pharmacy*  Follow-Up: Call back or seek an in-person evaluation if the symptoms worsen or if the condition fails to improve as anticipated.  [facility] [phone]  Other Instructions  Shingles  Shingles, or herpes zoster, is an infection. It gives you a skin rash and blisters. These infected areas may hurt a  lot. Shingles only happens if: You've had chickenpox. You've been given a shot called a vaccine to protect you from getting chickenpox. Shingles is rare in this case. What are the causes? Shingles is caused by a germ called the varicella-zoster virus. This is the same germ that causes chickenpox. After you're exposed to the germ, it stays in your body but is dormant. This means it isn't active. Shingles happens if the germ becomes active again. This can happen years after you're first exposed to the germ. What increases the risk? You may be more likely to get shingles if: You're older than 49 years of age. You're under a lot of stress. You have a weak immune system. The immune system is your body's defense system. It may be weak if: You have human immunodeficiency virus (HIV). You have acquired immunodeficiency syndrome (AIDS). You have cancer. You take medicines that weaken your immune system. These include organ transplant medicines. What are the signs or symptoms? The first symptoms of shingles may be itching, tingling, or pain. Your skin may feel like it's burning. A few days or weeks later, you'll get a rash. Here's what you can expect: The rash is likely to be on one side of your body. The rash may be shaped like a belt or a band. Over time, it will turn into blisters filled with fluid. The blisters will break open and change into scabs. The scabs will dry up in about 2-3 weeks. You  may also have: A fever. Chills. A headache. Nausea. How is this diagnosed? Shingles is diagnosed with a skin exam. A sample called a culture may be taken from one of your blisters and sent to a lab. This will show if you have shingles. How is this treated? The rash may last for several weeks. There's no cure for shingles, but your health care provider may give you medicines. These medicines may: Help with pain. Help with itching. Help with irritation and swelling. Help you get better sooner. Help  to prevent long-term problems. If the rash is on your face, you may need to see an eye doctor or an ear, nose, and throat (ENT) doctor. Follow these instructions at home: Medicines Take your medicines only as told by your provider. Put an anti-itch cream or numbing cream on the rash or blisters as told by your provider. Relieving itching and discomfort  To help with itching: Put cold, wet cloths called cold compresses on the rash or blisters. Take a cool bath. Try adding baking soda or dry oatmeal to the water. Do not bathe in hot water. Use calamine lotion on the rash or blisters. You can get this type of lotion at the store. Blister and rash care Keep your rash covered with a loose bandage. Wear loose clothes that don't rub on your rash. Take care of your rash as told by your provider. Make sure you: Wash your hands with soap and water for at least 20 seconds before and after you change your bandage. If you can't use soap and water, use hand sanitizer. Keep your rash and blisters clean by washing them with mild soap and cool water. Change your bandage. Check your rash every day for signs of infection. Check for: More redness, swelling, or pain. Fluid or blood. Warmth. Pus or a bad smell. Do not scratch your rash. Do not pick at your blisters. To help you not scratch: Keep your fingernails clean and cut short. Try to wear gloves or mittens when you sleep. General instructions Rest. Wash your hands often with soap and water for at least 20 seconds. If you can't use soap and water, use hand sanitizer. Washing your hands lowers your chance of getting a skin infection. Your infection can cause chickenpox in others. If you have blisters that aren't scabs yet, stay away from: Babies. Pregnant people. Children who have eczema. Older people who have organ transplants. People who have a long-term, or chronic, illness. Anyone who hasn't had chickenpox before. Anyone who hasn't gotten the  chickenpox vaccine. How is this prevented? Vaccines are the best way to prevent you from getting chickenpox or shingles. Talk with your provider about getting these shots. Where to find more information Centers for Disease Control and Prevention (CDC): tonerpromos.no Contact a health care provider if: Your pain doesn't get better with medicine. Your pain doesn't get better after the rash heals. You have any signs of infection around the rash. Your rash or blisters get worse. You have a fever or chills. Get help right away if: The rash is on your face or nose. You have pain in your face or by your eye. You lose feeling on one side of your face. You have trouble seeing. You have ear pain or ringing in your ear. This information is not intended to replace advice given to you by your health care provider. Make sure you discuss any questions you have with your health care provider. Document Revised: 04/05/2023 Document Reviewed: 08/18/2022 Elsevier Patient  Education  2024 [employer].   If you have been instructed to have an in-person evaluation today at a local Urgent Care facility, please use the link below. It will take you to a list of all of our available [facility], including address, phone number and hours of operation. Please do not delay care.  [facility]  If you or a family member do not have a primary care provider, use the link below to schedule a visit and establish care. When you choose a [facility] primary care physician or advanced practice provider, you gain a long-term partner in health. Find a Primary Care Provider  Learn more about [facility] in-office and virtual care options:  - Get Care Now "

## 2024-07-21 NOTE — Progress Notes (Signed)
 " Virtual Visit Consent   Randy Quinn, you are scheduled for a virtual visit with a [facility] provider today. Just as with appointments in the office, your consent must be obtained to participate. Your consent will be active for this visit and any virtual visit you may have with one of our providers in the next 365 days. If you have a MyChart account, a copy of this consent can be sent to you electronically.  As this is a virtual visit, video technology does not allow for your provider to perform a traditional examination. This may limit your provider's ability to fully assess your condition. If your provider identifies any concerns that need to be evaluated in person or the need to arrange testing (such as labs, EKG, etc.), we will make arrangements to do so. Although advances in technology are sophisticated, we cannot ensure that it will always work on either your end or our end. If the connection with a video visit is poor, the visit may have to be switched to a telephone visit. With either a video or telephone visit, we are not always able to ensure that we have a secure connection.  By engaging in this virtual visit, you consent to the provision of healthcare and authorize for your insurance to be billed (if applicable) for the services provided during this visit. Depending on your insurance coverage, you may receive a charge related to this service.  I need to obtain your verbal consent now. Are you willing to proceed with your visit today? Randy Quinn has provided verbal consent on 07/21/2024 for a virtual visit (video or telephone). Randy CHRISTELLA Dickinson, PA-C  Date: 07/21/2024 5:42 PM   Virtual Visit via Video Note   I, Randy Quinn, connected with  Randy Quinn  ([id number], Jun 26, 1976) on 07/21/2024 at  5:30 PM EST by a video-enabled telemedicine application and verified that I am speaking with the correct person using two identifiers.  Location: Patient: Virtual Visit  Location Patient: Home Provider: Virtual Visit Location Provider: Home Office   I discussed the limitations of evaluation and management by telemedicine and the availability of in person appointments. The patient expressed understanding and agreed to proceed.    History of Present Illness: Randy Quinn is a 49 y.o. who identifies as a male who was assigned male at birth, and is being seen today for shingles.  HPI: Rash This is a new problem. The current episode started in the past 7 days (Started Friday, but really noticed worsening on Saturday). The affected locations include the back (right side back, flank to front). The rash is characterized by burning and redness. Associated symptoms include fatigue. Pertinent negatives include no congestion, cough, facial edema, fever, shortness of breath or sore throat. Treatments tried: took Valacyclovir - old RX. The treatment provided no relief. His past medical history is significant for varicella.    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  Resting comfortably  at home.  Head is normocephalic, atraumatic.  No labored breathing.  Speech is clear and coherent with logical content.  Patient is alert and oriented at baseline.    Assessment and Plan: 1. Herpes zoster without complication (Primary) - valACYclovir  (VALTREX ) 1000 MG tablet;  Take 1 tablet (1,000 mg total) by mouth 3 (three) times daily for 7 days.  Dispense: 21 tablet; Refill: 0  - Treat with full 7-day Valacyclovir  taking three times daily - Cool compresses as needed - Avoid picking and scratching - Tylenol  and/or Ibuprofen  can be alternated every 4 hours if needed - Epsom salt soaks - Isolate until no new lesions and old lesions are crusting over - Seek in person evaluation if worsening  Follow Up Instructions: I discussed the assessment and treatment plan with the patient. The patient was provided an opportunity to ask questions and all were answered. The patient agreed with the plan and demonstrated an understanding of the instructions.  A copy of instructions were sent to the patient via MyChart unless otherwise noted below.    The patient was advised to call back or seek an in-person evaluation if the symptoms worsen or if the condition fails to improve as anticipated.    Randy HERO Ivet Guerrieri, PA-C     [1] No Known Allergies [2]  Current Outpatient Medications:    valACYclovir  (VALTREX ) 1000 MG tablet, Take 1 tablet (1,000 mg total) by mouth 3 (three) times daily for 7 days., Disp: 21 tablet, Rfl: 0   atorvastatin (LIPITOR) 40 MG tablet, Take 40 mg by mouth daily., Disp: , Rfl:    ciprofloxacin  (CIPRO ) 500 MG tablet, Take 1 tablet (500 mg total) by mouth every 12 (twelve) hours., Disp: 10 tablet, Rfl: 0   doxycycline  (VIBRAMYCIN ) 100 MG capsule, Take 1 capsule (100 mg total) by mouth 2 (two) times daily., Disp: 20 capsule, Rfl: 0  "
